# Patient Record
Sex: Female | Born: 1970 | Race: White | Hispanic: Yes | Marital: Single | Smoking: Never smoker
Health system: Southern US, Community
[De-identification: ages and names within clinical notes are randomized; demographics above are authoritative.]

## PROBLEM LIST (undated history)

## (undated) DIAGNOSIS — Z8639 Personal history of other endocrine, nutritional and metabolic disease: Secondary | ICD-10-CM

## (undated) HISTORY — PX: PELVIC LAPAROSCOPY: SHX162

## (undated) HISTORY — DX: Personal history of other endocrine, nutritional and metabolic disease: Z86.39

---

## 1998-02-18 ENCOUNTER — Inpatient Hospital Stay (HOSPITAL_COMMUNITY): Admission: AD | Admit: 1998-02-18 | Discharge: 1998-02-19 | Payer: Self-pay | Admitting: Obstetrics

## 2004-12-14 ENCOUNTER — Ambulatory Visit: Payer: Self-pay | Admitting: Obstetrics & Gynecology

## 2004-12-22 ENCOUNTER — Ambulatory Visit: Payer: Self-pay | Admitting: Obstetrics and Gynecology

## 2004-12-22 ENCOUNTER — Ambulatory Visit (HOSPITAL_COMMUNITY): Admission: RE | Admit: 2004-12-22 | Discharge: 2004-12-22 | Payer: Self-pay | Admitting: Obstetrics and Gynecology

## 2005-01-04 ENCOUNTER — Ambulatory Visit: Payer: Self-pay | Admitting: Obstetrics and Gynecology

## 2005-05-31 ENCOUNTER — Ambulatory Visit: Payer: Self-pay | Admitting: Obstetrics & Gynecology

## 2005-05-31 ENCOUNTER — Encounter: Payer: Self-pay | Admitting: Obstetrics & Gynecology

## 2005-09-27 ENCOUNTER — Emergency Department (HOSPITAL_COMMUNITY): Admission: EM | Admit: 2005-09-27 | Discharge: 2005-09-28 | Payer: Self-pay | Admitting: Emergency Medicine

## 2006-07-14 ENCOUNTER — Ambulatory Visit: Payer: Self-pay | Admitting: Gynecology

## 2006-07-14 ENCOUNTER — Encounter (INDEPENDENT_AMBULATORY_CARE_PROVIDER_SITE_OTHER): Payer: Self-pay | Admitting: Gynecology

## 2007-08-03 IMAGING — CR DG CHEST 2V
2 series · 2 of 2 positions shown · non-contrast
Comparison: None.

CLINICAL DATA: Cough.  
 CHEST - 2 VIEW:

[w chest pa]
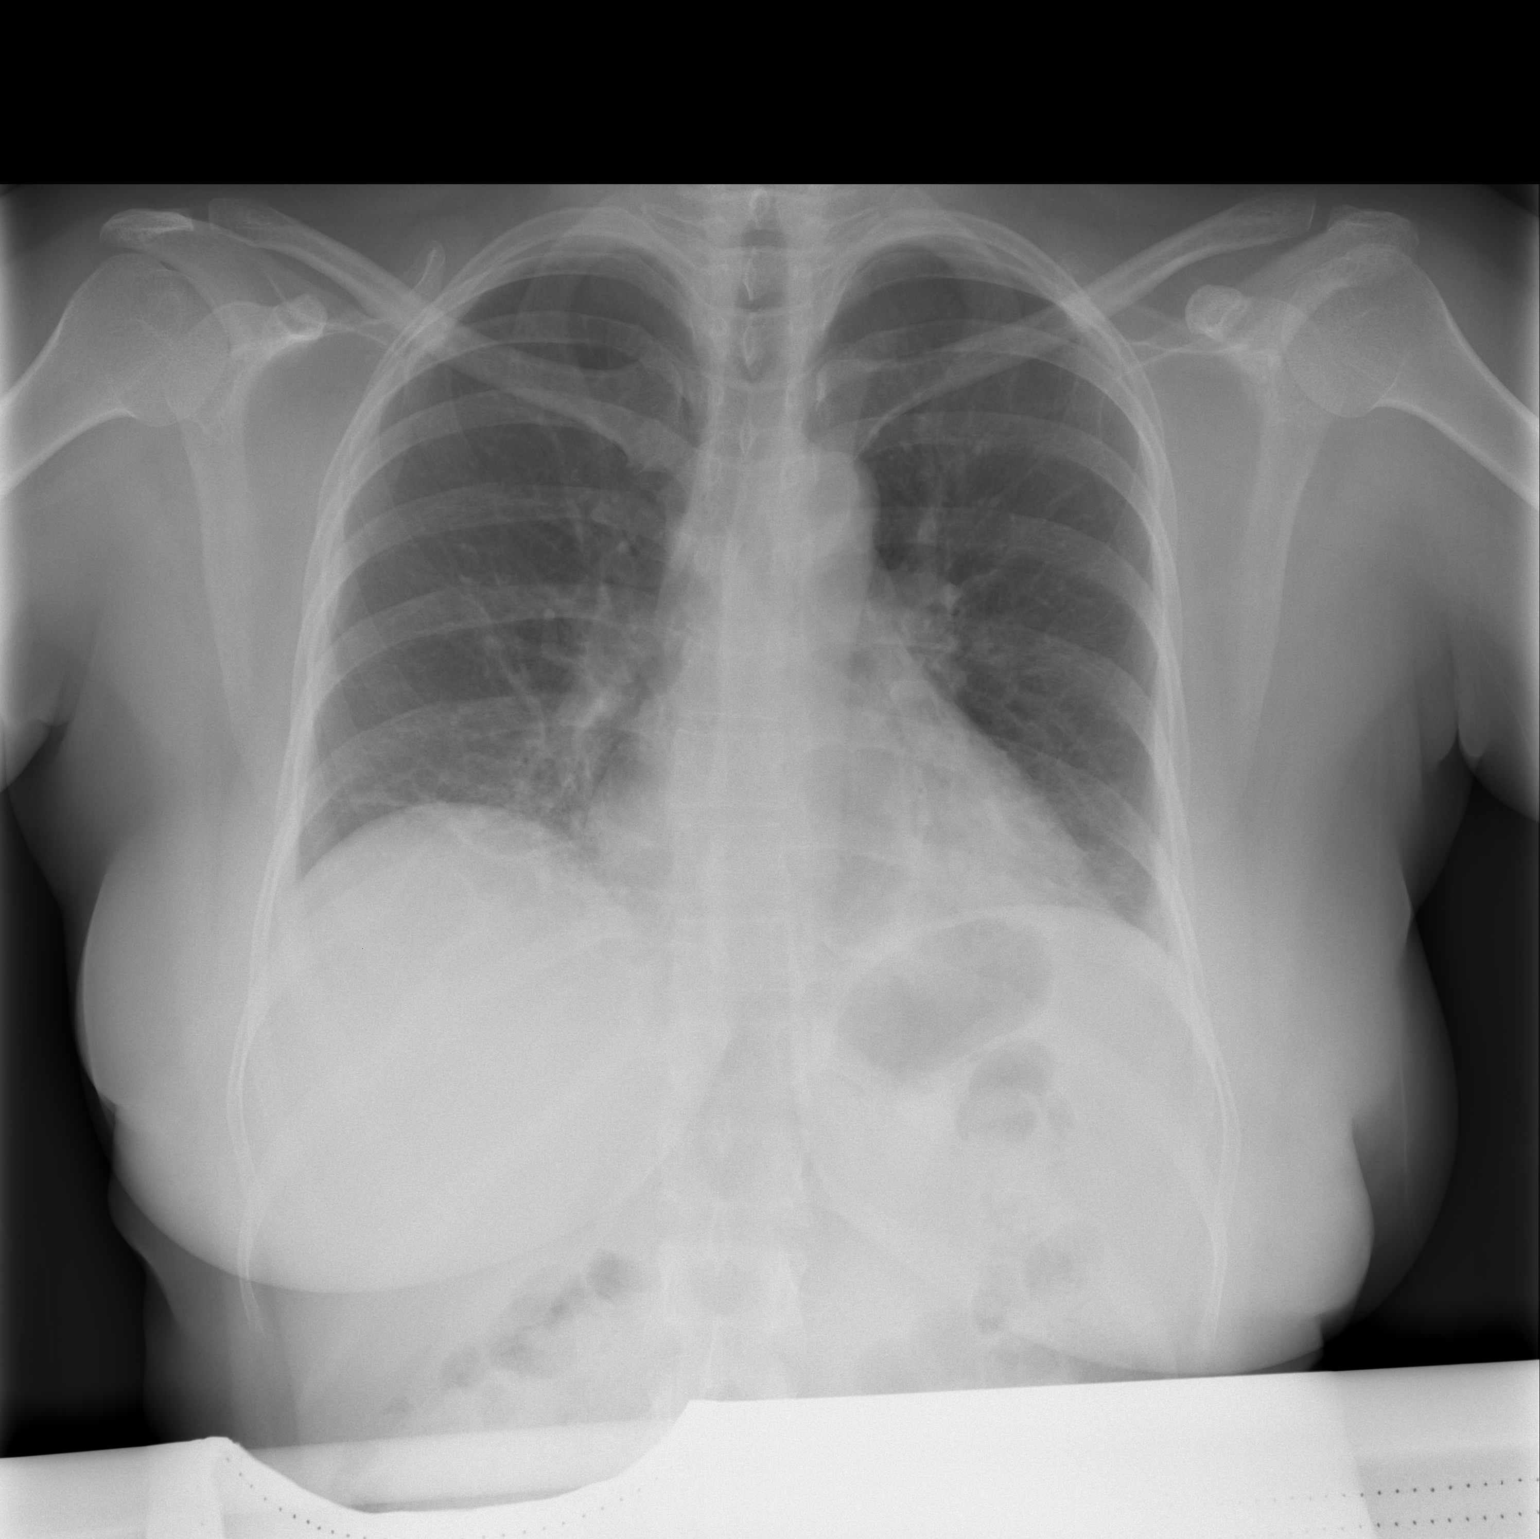

[w chest lat]
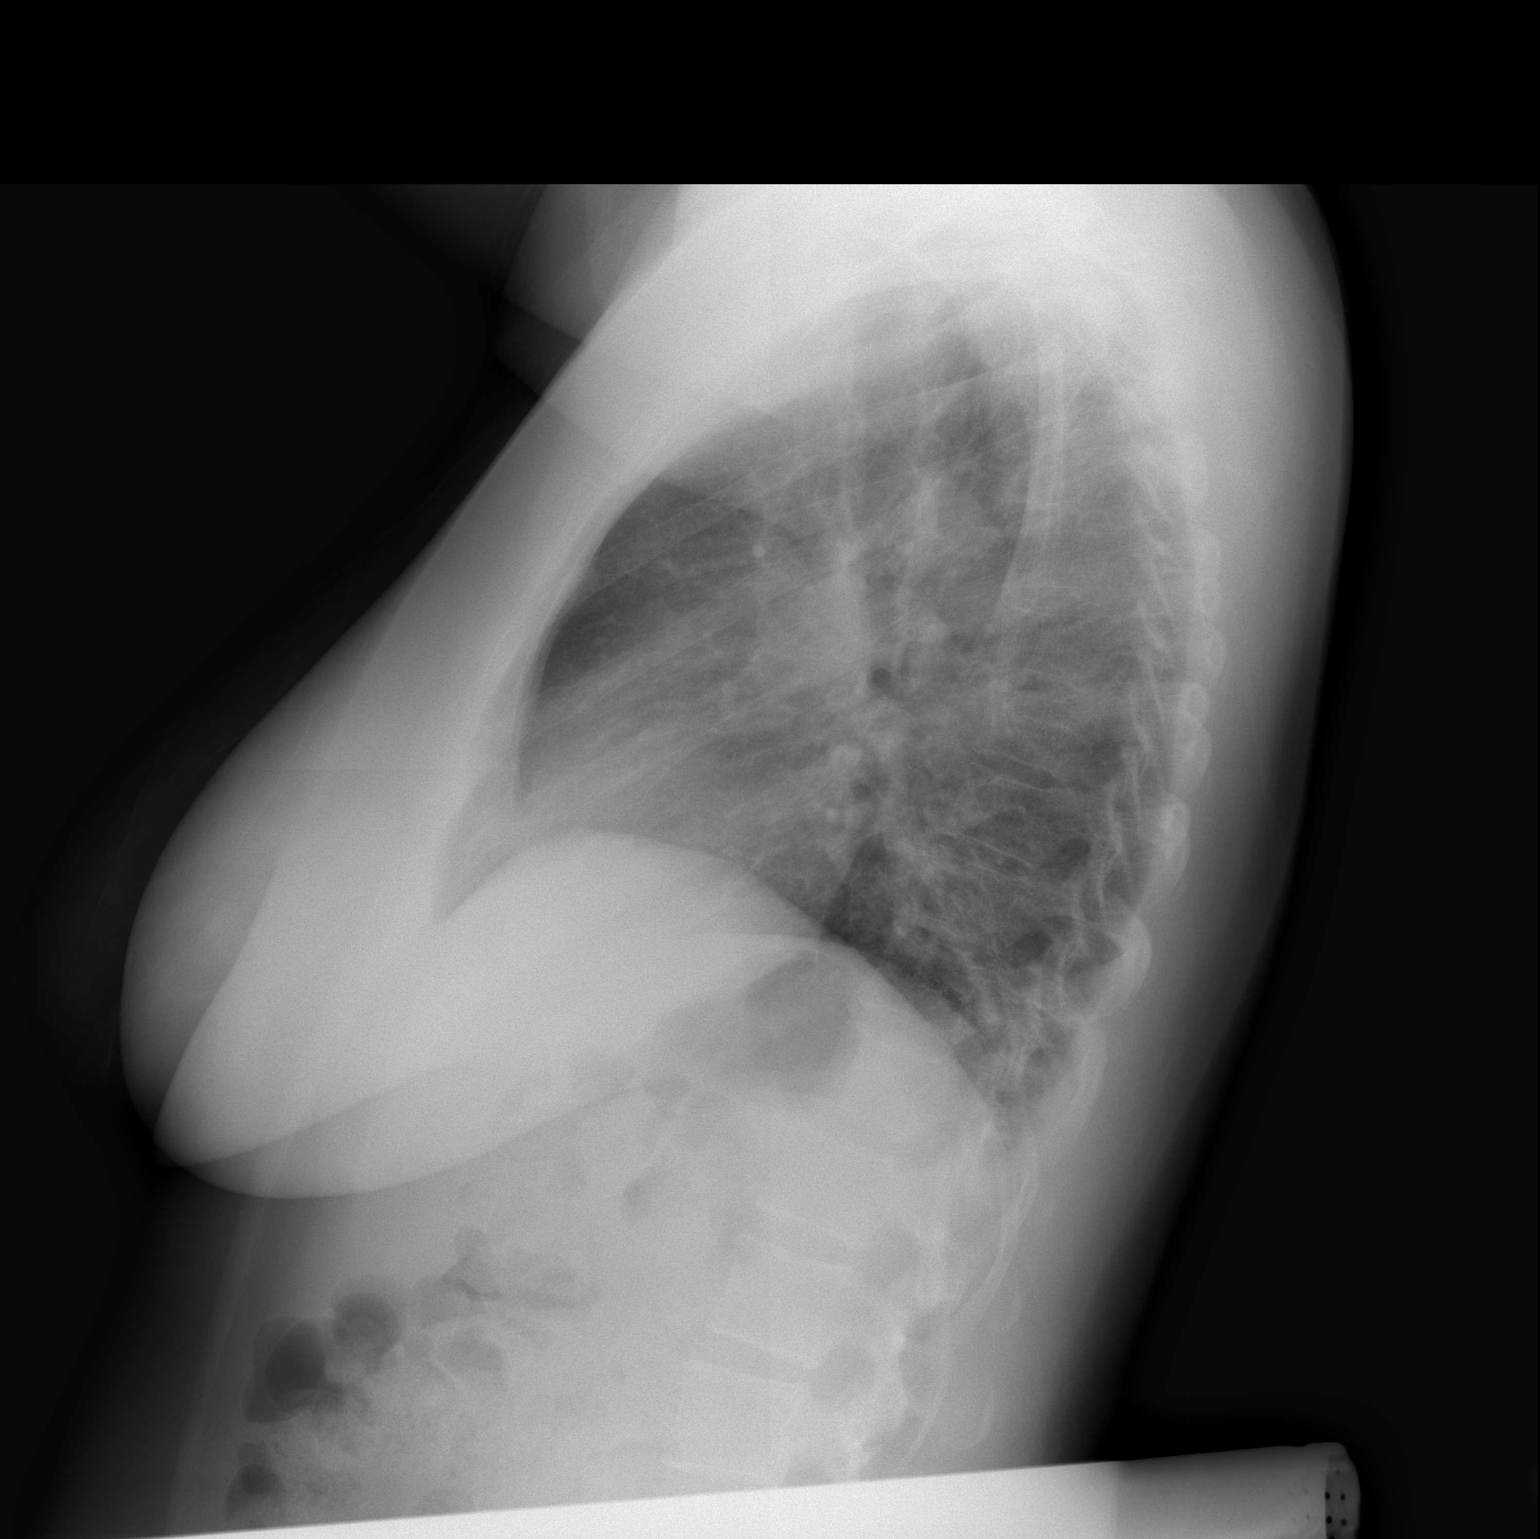

[2 of 2 positions shown; findings below may reference images not displayed]

FINDINGS: There is bibasilar atelectasis.  Heart size normal.  No effusions or edema.
IMPRESSION: Bibasilar atelectasis.

## 2007-08-17 ENCOUNTER — Encounter (INDEPENDENT_AMBULATORY_CARE_PROVIDER_SITE_OTHER): Payer: Self-pay | Admitting: Gynecology

## 2007-08-17 ENCOUNTER — Ambulatory Visit: Payer: Self-pay | Admitting: Gynecology

## 2008-10-10 ENCOUNTER — Encounter: Payer: Self-pay | Admitting: Obstetrics and Gynecology

## 2008-10-10 ENCOUNTER — Ambulatory Visit: Payer: Self-pay | Admitting: Obstetrics and Gynecology

## 2008-10-10 LAB — CONVERTED CEMR LAB
Clue Cells Wet Prep HPF POC: NONE SEEN
Trich, Wet Prep: NONE SEEN
Yeast Wet Prep HPF POC: NONE SEEN

## 2009-02-01 ENCOUNTER — Emergency Department (HOSPITAL_BASED_OUTPATIENT_CLINIC_OR_DEPARTMENT_OTHER): Admission: EM | Admit: 2009-02-01 | Discharge: 2009-02-01 | Payer: Self-pay | Admitting: Emergency Medicine

## 2009-02-04 ENCOUNTER — Emergency Department (HOSPITAL_BASED_OUTPATIENT_CLINIC_OR_DEPARTMENT_OTHER): Admission: EM | Admit: 2009-02-04 | Discharge: 2009-02-04 | Payer: Self-pay | Admitting: Emergency Medicine

## 2009-11-05 ENCOUNTER — Ambulatory Visit: Payer: Self-pay | Admitting: Obstetrics and Gynecology

## 2010-09-29 ENCOUNTER — Ambulatory Visit (INDEPENDENT_AMBULATORY_CARE_PROVIDER_SITE_OTHER): Payer: Commercial Managed Care - PPO | Admitting: Gynecology

## 2010-09-29 ENCOUNTER — Other Ambulatory Visit: Payer: Self-pay | Admitting: Gynecology

## 2010-09-29 ENCOUNTER — Other Ambulatory Visit (HOSPITAL_COMMUNITY)
Admission: RE | Admit: 2010-09-29 | Discharge: 2010-09-29 | Disposition: A | Discharge status: Home / Self Care | Payer: Commercial Managed Care - PPO | Source: Ambulatory Visit | Attending: Gynecology | Admitting: Gynecology

## 2010-09-29 DIAGNOSIS — Z01419 Encounter for gynecological examination (general) (routine) without abnormal findings: Secondary | ICD-10-CM

## 2010-09-29 DIAGNOSIS — Z124 Encounter for screening for malignant neoplasm of cervix: Secondary | ICD-10-CM | POA: Insufficient documentation

## 2010-09-29 DIAGNOSIS — Z13220 Encounter for screening for lipoid disorders: Secondary | ICD-10-CM

## 2010-09-29 DIAGNOSIS — Z833 Family history of diabetes mellitus: Secondary | ICD-10-CM

## 2010-09-29 DIAGNOSIS — R635 Abnormal weight gain: Secondary | ICD-10-CM

## 2010-10-05 ENCOUNTER — Other Ambulatory Visit (INDEPENDENT_AMBULATORY_CARE_PROVIDER_SITE_OTHER): Payer: Commercial Managed Care - PPO

## 2010-10-05 DIAGNOSIS — E0590 Thyrotoxicosis, unspecified without thyrotoxic crisis or storm: Secondary | ICD-10-CM

## 2010-10-29 ENCOUNTER — Other Ambulatory Visit: Payer: Commercial Managed Care - PPO

## 2010-11-22 LAB — GLUCOSE, CAPILLARY: Glucose-Capillary: 105 mg/dL — ABNORMAL HIGH (ref 70–99)

## 2010-11-22 LAB — URINALYSIS, ROUTINE W REFLEX MICROSCOPIC
Bilirubin Urine: NEGATIVE
Protein, ur: NEGATIVE mg/dL
Urobilinogen, UA: 0.2 mg/dL (ref 0.0–1.0)

## 2010-11-22 LAB — URINE MICROSCOPIC-ADD ON

## 2010-11-22 LAB — PREGNANCY, URINE: Preg Test, Ur: NEGATIVE

## 2010-12-28 NOTE — Group Therapy Note (Signed)
NAMECRISSA, SOWDER NO.:  [id number]   MEDICAL RECORD NO.:  [id number]          PATIENT TYPE:  WOC   LOCATION:  [facility]                   FACILITY:  [facility]   PHYSICIAN:  Ginger Carne, MD DATE OF BIRTH:  11-Jan-1971   DATE OF SERVICE:  08/17/2007                                  CLINIC NOTE   This patient is a 40 year old Hispanic female for routine gynecological  evaluation.  The patient says her menses are about every 30 days with  minimal dysmenorrhea.  She has had a tubal ligation 2 years ago.  A 14  point comprehensive review of systems within normal limits.  Please  refer to office records for past history.   PHYSICAL EXAMINATION:  GENERAL:  Pleasant female in no acute distress.  VITAL SIGNS:  Blood pressure 125/84, weight 150 pounds, height 63  inches, pulse 77.  HEENT:  Grossly normal.  CARDIOVASCULAR:  Without murmurs or enlargements.  BREAST:  No masses, thickenings, discharge or tenderness.  Axilla and  supraclavicular nodes negative.  CHEST:  Clear to percussion and auscultation.  Extremities, lymphatic, skin, neurological, cardiovascular all within  normal limits.  ABDOMEN:  Soft without gross hepatosplenomegaly.  GYN:  Pelvic exam:  Pap performed.  Uterus small, anteverted __________  .  Both adnexa palpable and found to be negative.   IMPRESSION:  Normal gynecological examination.  No new family history  for carcinoma or coronary artery heart disease.           ______________________________  Ginger Carne, MD     SHB/MEDQ  D:  08/17/2007  T:  08/17/2007  Job:  [id number]

## 2010-12-28 NOTE — Group Therapy Note (Signed)
NAMECorinna Lines NO.:  [id number]   MEDICAL RECORD NO.:  [id number]           PATIENT TYPE:   LOCATION:  [facility]                     FACILITY:   PHYSICIAN:  Argentina Donovan, MD             DATE OF BIRTH:   DATE OF SERVICE:  10/10/2008                                  CLINIC NOTE   The patient is a 40 year old English-speaking Hispanic female, gravida  2, para 2-0-0-2, who is in for annual examination.  She has never had an  abnormal Pap smear.  She has no significant complaints with exception of  occasional vulvar irritation and itching.  We told her we would do check  for infection, and also counseled her about using irritants such as  soaps and toilet tissue that may irritate.   Otherwise, her review of systems is negative.   She weighs 150 pounds and is 5 feet 3 inches tall, blood pressure  102/64.  NECK:  Supple with thyroid symmetrical with no masses.  LUNGS:  Clear to auscultation and percussion.  HEART:  No murmur, normal sinus rhythm.  BREASTS:  Symmetrical, somewhat pendulous with no masses.  No nipple  discharge.  No supraclavicular or axillary node.  ABDOMEN:  Soft, nontender.  No masses, no organomegaly.  EXTERNAL GENITALIA:  Normal.  BUS within normal limits.  Vagina is clean  and well-rugated.  Cervix is clean and parous.  Uterus anterior, normal  size, shape, and consistency.  The adnexa could not be well-outlined.   IMPRESSION:  Normal gynecological examination.  Wet prep was taken as  well as Pap smear.           ______________________________  Argentina Donovan, MD     PR/MEDQ  D:  10/10/2008  T:  10/10/2008  Job:  [id number]

## 2010-12-31 NOTE — Op Note (Signed)
Bethany Sheppard, Bethany Sheppard            ACCOUNT NO.:  [id number]   MEDICAL RECORD NO.:  [id number]          PATIENT TYPE:  AMB   LOCATION:  [facility]                           [facility]   PHYSICIAN:  Phil D. Okey Dupre, M.D.     DATE OF BIRTH:  [birth year]   DATE OF PROCEDURE:  12/22/2004  DATE OF DISCHARGE:                                 OPERATIVE REPORT   PROCEDURES:  1.  Removal of intrauterine contraceptive device.  2.  Laparoscopic sterilization.   PREOPERATIVE DIAGNOSES:  1.  Intrauterine device in place.  2.  Voluntary sterilization.   POSTOPERATIVE DIAGNOSIS:  1.  Intrauterine device in place.  2.  Voluntary sterilization.   SURGEON:  Javier Glazier. Okey Dupre, M.D.   ESTIMATED BLOOD LOSS:  Minimal.   POSTOPERATIVE CONDITION:  Satisfactory.   Procedure went as follows:  After satisfactory general anesthesia with the  patient in dorsal semilithotomy position, the perineum, vagina and abdomen  prepped and draped in usual sterile manner.  Bimanual pelvic examination under anesthesia revealed the uterus anterior,  freely movable, normal free adnexa, normal size, shape and consistency.  A  straight red catheter was used to empty the bladder and a ring forceps was  used to remove am IUD that was in place.  This anterior lip of the cervix  was then grasped with a single-tooth tenaculum and a uterine clip placed  into the uterus, attached to the cervix for mobilization of the uterus.  A 1  cm transverse incision made just below the umbilicus and a Veress needle  inserted in the peritoneal cavity using the usual precautions to make sure  we were in the peritoneal cavity.  Approximately 2 L carbon dioxide slowly  was insufflated into the peritoneal cavity.  Equal tympany occurred over the  entire abdominal wall.  The Veress needle was removed, the laparoscopic  trocar inserted into the peritoneal cavity and the trocar removed from the  sleeve lap.  Laparoscope inserted.  Both tubes were easily  visualized as  well as the pelvic organs, which were for completely normal with the  exception of a corpus luteum cyst on the left ovary.  Then each tube was  grasped with a coagulating grasper and coagulated blanching occurred at two  separate areas on the tube.  The area observed for bleeding, none was noted  tenaculum.  The scope was removed from the sleeve and as much CO2 as  possible expressed through the  sleeve, the sleeve removed and the incision closed with a 3-0 Vicryl catgut  suture.  We close the fascia and then was run up for a subcuticular closure.  A dry sterile dressing was applied.  The patient transferred to the recovery  room after the tenacula were removed from the vagina.      PDR/MEDQ  D:  12/22/2004  T:  12/22/2004  Job:  [id number]

## 2010-12-31 NOTE — Group Therapy Note (Signed)
NAMESOPHIEA, Bethany Sheppard NO.:  [id number]   MEDICAL RECORD NO.:  [id number]          PATIENT TYPE:  WOC   LOCATION:  [facility]                   FACILITY:  [facility]   PHYSICIAN:  Elsie Lincoln, MD      DATE OF BIRTH:  [birth year]   DATE OF SERVICE:                                    CLINIC NOTE   HISTORY OF PRESENT ILLNESS:  The patient is a 40 year old G2, P2-0-0-2, LMP  November 22, 2004, who is presenting for workup for BTL.  The patient received  funding through __________ [employer].  The patient  understands that this procedure is meant to be permanent and not to be  reversed.  She does understand that there is a 3-4 chance out of a 1000 that  she could become pregnant again.  If pregnancy does occur after a tubal,  there is a much increased risk of ectopic pregnancy.  The patient  understands and accepts these risks.  The patient denies any sicknesses, and  feels well today.   PAST MEDICAL HISTORY:  Denies.   PAST SURGICAL HISTORY:  Denies.   PAST GYNECOLOGICAL HISTORY:  No history of ovarian cysts, fibroid tumors of  the uterus or sexually-transmitted diseases.  She is currently using an IUD  which we will remove at the time of surgery.  The patient has had abnormal  Pap smears in the past, and was followed every 6 months, and is now followed  yearly.  We will request these records from [facility].   ALLERGIES:  Denies.   MEDICATIONS:  Denies.   REVIEW OF SYSTEMS:  Positive for constipation, and the patient takes prune  juice periodically to aid in relieving constipation.   PHYSICAL EXAMINATION:  VITAL SIGNS:  Pulse 76, blood pressure 109/66, weight  143.6, height 5 feet 2 inches.  GENERAL:  Well-nourished, well-developed in no apparent distress.  NECK:  Thyroid with no masses.  Neck is supple.  HEAD:  Normocephalic, atraumatic.  MOUTH:  Good dentition.  BREASTS:  No skin changes, masses or lymphadenopathy.  CHEST:  Clear to  auscultation bilaterally.  HEART:  Regular rate and rhythm.  ABDOMEN:  Soft, nontender, nondistended.  GENITALIA:  Tanner 5.  Vagina is pink.  Normal rugae.  No discharge or  blood.  Cervix closed, nontender.  Uterus mobile and nontender.  Adnexa with  no masses, nontender.  EXTREMITIES:  No edema, and nontender.   ASSESSMENT AND PLAN:  A 40 year old well female for a preoperative visit for  laparoscopic bilateral tubal ligation.  The patient is scheduled for Dec 22, 2004 at 12:30 with Dr. Okey Dupre.  As stated above, we will get Pap results from  [facility] and also remove her IUD at the time of surgery.      KL/MEDQ  D:  12/14/2004  T:  12/14/2004  Job:  [id number]

## 2010-12-31 NOTE — Group Therapy Note (Signed)
NAMEASHONTE, ANGELUCCI NO.:  [id number]   MEDICAL RECORD NO.:  [id number]          PATIENT TYPE:  WOC   LOCATION:  [facility]                   [facility]ACILITY:  [facility]   PHYSICIAN:  Ginger Carne, MD DATE OF BIRTH:  1971-07-26   DATE OF SERVICE:  07/14/2006                                  CLINIC NOTE   SUBJECTIVE:  The patient is a 40 year old, Hispanic female who is seen  today for routine gynecological evaluation.  Menses every 30 days with  minimal dysmenorrhea and no specific complaints.  A 14-point  comprehensive review of systems unremarkable.  Please refer to office  records for further history.   PHYSICAL EXAMINATION:  VITAL SIGNS:  Blood pressure 114/71, weight 150  pounds, height 5 feet 3 inches, pulse 77 and regular.  HEENT:  Grossly normal.  BREASTS:  Breast exam without masses, discharge, thickening or  tenderness.  CHEST:  Clear to percussion and auscultation.  CARDIAC:  Without murmurs or enlargements, regular rate and rhythm.  EXTREMITIES/LYMPHATIC/SKIN/NEUROLOGICAL/MUSCULOSKELETAL:  Within normal  limits.  ABDOMEN:  Soft without gross hepatosplenomegaly.  PELVIC:  Pap performed.  Uterus small, anteverted and flexed.  Both  adnexa palpable and found to be normal.  RECTAL:  Rectal exam deferred.   IMPRESSION:  Normal gynecological evaluation.   PLAN:  The patient will return in 1 year unless there are specific  problems.           ______________________________  Ginger Carne, MD     SHB/MEDQ  D:  07/14/2006  T:  07/14/2006  Job:  [id number]

## 2010-12-31 NOTE — Group Therapy Note (Signed)
Bethany Sheppard, WEBERG NO.:  [id number]   MEDICAL RECORD NO.:  [id number]          PATIENT TYPE:  WOC   LOCATION[facility]                   [facility]   PHYSICIAN:  Elsie Lincoln, MD      DATE OF BIRTH:  1970/12/29   DATE OF SERVICE:                                    CLINIC NOTE   HISTORY:  Patient is a 40 year old female who has been seen by me in May  2006 for a preop appointment for a bilateral tubal ligation.  She did  undergo this on Dec 22, 2004 and is not having any problems.  She comes  today presenting of bilateral breast pain during her menses.  Any other time  she has not felt any masses in her breasts.  She has not noted any discharge  or skin changes.  Patient also says it is time for her Pap smear.  She  denies any change in her medical or surgical history.  She is not on any  medications and she has no allergies.  Last menstrual period was May 24, 2005.  Review of symptoms negative except for breast pain.   EXAMINATION:  VITALS:  Temperature 97.7, pulse 78, blood pressure 117/73,  weight 148.5.  BREASTS:  Breasts fibrocystic however nontender, no discrete masses felt, no  lymphadenopathy.  ABDOMEN:  Abdomen soft, nontender, nondistended, well-healed umbilical  incision.  GENITALIA:  Tanner 5.  Vagina - no discharge, no blood.  Cervix - large,  nontender, closed.  Uterus - nontender, mobile, slight amount of descent  however not a bother to the patient.  Adnexa - no masses, nontender.   ASSESSMENT AND PLAN:  1.  [age]-year-old female with well exam today.  Pap smear and      cultures done.  2.  Evening primrose oil 1.5 g twice daily for breast pain.  3.  Return to clinic in a year.           ______________________________  Elsie Lincoln, MD     KL/MEDQ  D:  05/31/2005  T:  05/31/2005  Job:  [id number]

## 2011-05-20 ENCOUNTER — Encounter: Payer: Self-pay | Admitting: Anesthesiology

## 2011-05-24 ENCOUNTER — Ambulatory Visit (INDEPENDENT_AMBULATORY_CARE_PROVIDER_SITE_OTHER): Payer: Commercial Managed Care - PPO | Admitting: *Deleted

## 2011-05-24 DIAGNOSIS — E0590 Thyrotoxicosis, unspecified without thyrotoxic crisis or storm: Secondary | ICD-10-CM

## 2011-05-24 LAB — T3 UPTAKE: T3 Uptake: 39.5 % — ABNORMAL HIGH (ref 22.5–37.0)

## 2011-05-26 ENCOUNTER — Other Ambulatory Visit: Payer: Self-pay | Admitting: *Deleted

## 2011-05-26 DIAGNOSIS — E039 Hypothyroidism, unspecified: Secondary | ICD-10-CM

## 2011-06-21 ENCOUNTER — Ambulatory Visit (INDEPENDENT_AMBULATORY_CARE_PROVIDER_SITE_OTHER): Payer: Commercial Managed Care - PPO | Admitting: Gynecology

## 2011-06-21 ENCOUNTER — Encounter: Payer: Self-pay | Admitting: Gynecology

## 2011-06-21 VITALS — BP 114/70 | Wt 123.5 lb

## 2011-06-21 DIAGNOSIS — F329 Major depressive disorder, single episode, unspecified: Secondary | ICD-10-CM | POA: Insufficient documentation

## 2011-06-21 DIAGNOSIS — F3289 Other specified depressive episodes: Secondary | ICD-10-CM

## 2011-06-21 DIAGNOSIS — F32A Depression, unspecified: Secondary | ICD-10-CM | POA: Insufficient documentation

## 2011-06-21 DIAGNOSIS — E069 Thyroiditis, unspecified: Secondary | ICD-10-CM | POA: Insufficient documentation

## 2011-06-21 NOTE — Progress Notes (Signed)
Patient is a 40 year old gravida 2 para 2 who was seen in the office as a new patient in February of this year. Her Pap smear and routine labs were normal with the exception of a TSH which was found to be elevated at 12.80 she returned 2 weeks later and had a full thyroid panel with the following results: TSH was normal at 1.950 T4 was normal at 6.2 and the T3 uptake was slightly elevated at 37.4 upper limits of normal being 37%. She was asked to return to the office 6 months later to repeat the thyroid function test which she had done on October 11 of this year her TSH was normal at 1.55 and her T4 was normal at 6.7 but her T3 uptake was elevated at 39.5. There appears to have been a gradual increase on T3 uptake over the course of the past 6 months indicative of T3 toxicosis. For this reason we are going to refer her to the endocrinologist for further testing. On today's exam she had no thyromegaly and no carotid bruits. She is suffering from depression such as like a sleep and her gene decrease appetite but part of this may be attributed to her daughter and we will wait a college. We're going to place her on an antidepressant agent such as Prestiq 50 mg to take daily for 6 months. We'll for a copy of this office note to my endocrinology colleague for further evaluation.

## 2011-06-30 ENCOUNTER — Telehealth: Payer: Self-pay | Admitting: *Deleted

## 2011-06-30 NOTE — Telephone Encounter (Signed)
Patient was set up with Dr. Talmage Nap on 08/01/11 @10 :30am.  Patient was informed by there office.

## 2011-06-30 NOTE — Telephone Encounter (Signed)
Message copied by Libby Maw on Thu Jun 30, 2011 11:50 AM ------      Message from: Ok Edwards      Created: Tue Jun 21, 2011 12:59 PM       Azariya Freeman please set up an appointment for/consult with Dr. Lurene Shadow endocrinologist I have sent her a consult note to her already. Diagnosis is T3 thyrotoxicosis. Patient waiting for your call. Thank you

## 2011-07-15 ENCOUNTER — Other Ambulatory Visit: Payer: Self-pay | Admitting: Gynecology

## 2011-07-15 DIAGNOSIS — Z1231 Encounter for screening mammogram for malignant neoplasm of breast: Secondary | ICD-10-CM

## 2011-08-22 ENCOUNTER — Ambulatory Visit (HOSPITAL_COMMUNITY)
Admission: RE | Admit: 2011-08-22 | Discharge: 2011-08-22 | Disposition: A | Discharge status: Home / Self Care | Payer: Commercial Managed Care - PPO | Source: Ambulatory Visit | Attending: Gynecology | Admitting: Gynecology

## 2011-08-22 DIAGNOSIS — Z1231 Encounter for screening mammogram for malignant neoplasm of breast: Secondary | ICD-10-CM | POA: Insufficient documentation

## 2012-01-27 ENCOUNTER — Ambulatory Visit (INDEPENDENT_AMBULATORY_CARE_PROVIDER_SITE_OTHER): Payer: Commercial Managed Care - PPO | Admitting: Gynecology

## 2012-01-27 ENCOUNTER — Other Ambulatory Visit (HOSPITAL_COMMUNITY)
Admission: RE | Admit: 2012-01-27 | Discharge: 2012-01-27 | Disposition: A | Discharge status: Home / Self Care | Payer: Commercial Managed Care - PPO | Source: Ambulatory Visit | Attending: Gynecology | Admitting: Gynecology

## 2012-01-27 ENCOUNTER — Encounter: Payer: Self-pay | Admitting: Gynecology

## 2012-01-27 VITALS — BP 110/70 | Ht 64.5 in | Wt 140.0 lb

## 2012-01-27 DIAGNOSIS — R635 Abnormal weight gain: Secondary | ICD-10-CM

## 2012-01-27 DIAGNOSIS — N898 Other specified noninflammatory disorders of vagina: Secondary | ICD-10-CM

## 2012-01-27 DIAGNOSIS — N760 Acute vaginitis: Secondary | ICD-10-CM

## 2012-01-27 DIAGNOSIS — Z01419 Encounter for gynecological examination (general) (routine) without abnormal findings: Secondary | ICD-10-CM

## 2012-01-27 DIAGNOSIS — L293 Anogenital pruritus, unspecified: Secondary | ICD-10-CM

## 2012-01-27 DIAGNOSIS — A499 Bacterial infection, unspecified: Secondary | ICD-10-CM

## 2012-01-27 DIAGNOSIS — Z1159 Encounter for screening for other viral diseases: Secondary | ICD-10-CM | POA: Insufficient documentation

## 2012-01-27 DIAGNOSIS — Z113 Encounter for screening for infections with a predominantly sexual mode of transmission: Secondary | ICD-10-CM

## 2012-01-27 DIAGNOSIS — B9689 Other specified bacterial agents as the cause of diseases classified elsewhere: Secondary | ICD-10-CM

## 2012-01-27 DIAGNOSIS — E069 Thyroiditis, unspecified: Secondary | ICD-10-CM

## 2012-01-27 LAB — CBC WITH DIFFERENTIAL/PLATELET
Eosinophils Relative: 2 % (ref 0–5)
HCT: 38.3 % (ref 36.0–46.0)
Lymphocytes Relative: 23 % (ref 12–46)
Lymphs Abs: 1 10*3/uL (ref 0.7–4.0)
MCH: 29.9 pg (ref 26.0–34.0)
MCHC: 32.9 g/dL (ref 30.0–36.0)
Monocytes Absolute: 0.5 10*3/uL (ref 0.1–1.0)
Neutro Abs: 2.9 10*3/uL (ref 1.7–7.7)
Neutrophils Relative %: 63 % (ref 43–77)
RDW: 15.4 % (ref 11.5–15.5)
WBC: 4.5 10*3/uL (ref 4.0–10.5)

## 2012-01-27 LAB — LIPID PANEL
Cholesterol: 151 mg/dL (ref 0–200)
LDL Cholesterol: 83 mg/dL (ref 0–99)
Total CHOL/HDL Ratio: 2.6 Ratio
Triglycerides: 49 mg/dL (ref <150)

## 2012-01-27 LAB — GLUCOSE, RANDOM: Glucose, Bld: 79 mg/dL (ref 70–99)

## 2012-01-27 LAB — WET PREP FOR TRICH, YEAST, CLUE: WBC, Wet Prep HPF POC: NONE SEEN

## 2012-01-27 LAB — THYROID PANEL WITH TSH
Free Thyroxine Index: 2.6 (ref 1.0–3.9)
T4, Total: 7.1 ug/dL (ref 5.0–12.5)

## 2012-01-27 MED ORDER — METRONIDAZOLE 500 MG PO TABS: 500.0000 mg | ORAL_TABLET | Freq: Two times a day (BID) | ORAL | Status: AC

## 2012-01-27 NOTE — Progress Notes (Signed)
Bethany Sheppard 11-16-1970 [id number]   History:    41 y.o.  for annual gyn exam with complaint of one-month history of vaginal pruritus. She's had a new sexual partner in the past 3 months. She also was followed last year but by Dr. Lurene Shadow for a mild thyroiditis whereas her T3 was elevated and the rest her thyroid panel was normal. She is to schedule followup appointment next couple weeks. She states her menstrual cycles are regular. She had a previous tubal ligation. Her last mammogram November 2012 which was normal. She does her monthly self breast examination. Last year she suffered from depression but no longer. She had lost weight and now has regained it. She was weighing 127 is up to 140 now. Otherwise she has been doing well.  Past medical history,surgical history, family history and social history were all reviewed and documented in the EPIC chart.  Gynecologic History Patient's last menstrual period was 01/02/2012. Contraception: tubal ligation Last Pap: 2012. Results were: normal Last mammogram: 2012. Results were: normal  Obstetric History OB History    Grav Para Term Preterm Abortions TAB SAB Ect Mult Living   2 2 2       2      # Outc Date GA Lbr Len/2nd Wgt Sex Del Anes PTL Lv   1 TRM     F SVD  No Yes   2 TRM     F SVD   Yes       ROS: A ROS was performed and pertinent positives and negatives are included in the history.  GENERAL: No fevers or chills. HEENT: No change in vision, no earache, sore throat or sinus congestion. NECK: No pain or stiffness. CARDIOVASCULAR: No chest pain or pressure. No palpitations. PULMONARY: No shortness of breath, cough or wheeze. GASTROINTESTINAL: No abdominal pain, nausea, vomiting or diarrhea, melena or bright red blood per rectum. GENITOURINARY: No urinary frequency, urgency, hesitancy or dysuria. MUSCULOSKELETAL: No joint or muscle pain, no back pain, no recent trauma. DERMATOLOGIC: No rash, no itching, no lesions. ENDOCRINE: No  polyuria, polydipsia, no heat or cold intolerance. No recent change in weight. HEMATOLOGICAL: No anemia or easy bruising or bleeding. NEUROLOGIC: No headache, seizures, numbness, tingling or weakness. PSYCHIATRIC: No depression, no loss of interest in normal activity or change in sleep pattern.     Exam: chaperone present  BP 110/70  Ht 5' 4.5" (1.638 m)  Wt 140 lb (63.504 kg)  BMI 23.66 kg/m2  LMP 01/02/2012  Body mass index is 23.66 kg/(m^2).  General appearance : Well developed well nourished female. No acute distress HEENT: Neck supple, trachea midline, no carotid bruits, no thyroidmegaly Lungs: Clear to auscultation, no rhonchi or wheezes, or rib retractions  Heart: Regular rate and rhythm, no murmurs or gallops Breast:Examined in sitting and supine position were symmetrical in appearance, no palpable masses or tenderness,  no skin retraction, no nipple inversion, no nipple discharge, no skin discoloration, no axillary or supraclavicular lymphadenopathy Abdomen: no palpable masses or tenderness, no rebound or guarding Extremities: no edema or skin discoloration or tenderness  Pelvic:  Bartholin, Urethra, Skene Glands: Within normal limits             Vagina: No gross lesions or discharge, menstrual blood noted  Cervix: No gross lesions or discharge  Uterus  anteverted, normal size, shape and consistency, non-tender and mobile  Adnexa  Without masses or tenderness  Anus and perineum  normal   Rectovaginal  normal sphincter tone without palpated masses  or tenderness             Hemoccult not done   Wet prep: Moderate amount of clear cells were noted along with numerous to count white blood cells and pos Amine Test  Assessment/Plan:  41 y.o. female for annual exam with past history of mild thyroiditis (elevated T3 and remainder thyroid panel normal in 2012). Thyroid panel repeated today. Patient to followup with Dr. Lurene Shadow the next few weeks we'll forward this office note and labs  as well. The following labs will also be drawn today: Fasting blood sugar, fasting lipid profile, CBC, urinalysis and Pap smear. We did discuss the new screening guidelines her Pap smear every 3 years. Since she's had a new sexual partner we'll do the Pap smear today and if normal will follow by the guidelines and do a Pap smear with 3 years. She was reminded to do her mammogram at the end of the year and to continue her monthly self breast examination. We discouraged her to do regular vaginal douching. She will use either refresh or Luvena twice a week. She will be prescribed Flagyl 500 mg to take 1 by mouth twice a day for her bacterial vaginosis since she's currently menstruating. Will notify her if there is any abnormality in any of the above-mentioned test otherwise we will see her back in one year or when necessary. She was reminded to follow up with Dr. Lurene Shadow.   Ok Edwards MD, 10:39 AM 01/27/2012

## 2012-01-27 NOTE — Patient Instructions (Addendum)
Mantenimiento de la salud en las mujeres (Health Maintenance, Females) Un estilo de vida saludable y los cuidados preventivos pueden favorecer la salud y el bienestar.   Haga exmenes regulares de la salud en general, dentales y de los ojos.   Consuma una dieta saludable. Los alimentos como vegetales, frutas, granos enteros, productos lcteos descremados y protenas magras contienen los nutrientes que usted necesita sin necesidad de consumir muchas caloras. Disminuya el consumo de alimentos con alto contenido de grasas slidas, azcar y sal agregadas. Si es necesario, pdaleinformacin acerca de una dieta adecuada a su mdico.   La actividad fsica regular es una de las cosas ms importantes que puede hacer por su salud. Los adultos deben hacer al menos 150 minutos de ejercicios de intensidad moderada (cualquier actividad que aumente la frecuencia cardaca y lo haga transpirar) cada semana. Adems, la mayora de los adultos necesita ejercicios de fortalecimiento muscular 2  ms das por semana.    Mantenga un peso saludable. El ndice de masa corporal (IMC) es una herramienta que identifica posibles problemas con el peso. Proporciona una estimacin de la grasa corporal basndose en el peso y la altura. El mdico podr determinar su IMC y podr ayudarlo a lograr o mantener un peso saludable. Para los adultos de 20 aos o ms:   Un IMC menor a 18,5 se considera bajo peso.   Un IMC entre 18,5 y 24,9 es normal.   Un IMC entre 25 y 29,9 es sobrepeso.   Un IMC entre 30 o ms es obesidad.   Mantenga un nivel normal de lpidos y colesterol en sangre practicando actividad fsica y minimizando la ingesta de grasas saturadas. Consuma una dieta balanceada e incluya variedad de frutas y vegetales. Los anlisis de lpidos y colesterol en sangre deben comenzar a los 20 aos y repetirse cada 5 aos. Si los niveles de colesterol son altos, tiene ms de 50 aos o tiene riesgo elevado de sufrir enfermedades  cardacas, necesitar controlarse con ms frecuencia.Si tiene niveles elevados de lpidos y colesterol, debe recibir tratamiento con medicamentos, si la dieta y el ejercicio no son efectivos.   Si fuma, consulte con el profesional acerca de las opciones para dejar de hacerlo. Si no lo hace, no comience.   Si est embarazada no beba alcohol. Si est amamantando, beba alcohol con prudencia. Si elige beber alcohol, no se exceda de 1 medida por da. Se considera una medida a 12 onzas (355 ml) de cerveza, 5 onzas (148 ml) de vino, o 1,5 onzas (44 ml) de licor.   Evite el alcohol y el consumo de drogas. No comparta agujas. Pida ayuda si necesita asistencia o instrucciones con respecto a abandonar el consumo de alcohol, cigarrillos o drogas.   La hipertensin arterial causa enfermedades cardacas y aumenta el riesgo de ictus. Debe controlar su presin arterial al menos cada 1 o 2 aos. La presin arterial elevada que persiste debe tratarse con medicamentos si la prdida de peso y el ejercicio no son efectivos.   Si tiene entre 55 y 79 aos, consulte a su mdico si debe tomar aspirina para prevenir enfermedades cardacas.   Los anlisis para la diabetes incluyen la toma de una muestra de sangre para controlar el nivel de azcar en la sangre durante el ayuno. Debe hacerlo cada 3 aos despus de los 45 aos si est dentro de su peso normal y sin factores de riesgo para la diabetes. Las pruebas deben comenzar a edades tempranas o llevarse a cabo con ms frecuencia   si tiene sobrepeso y al menos 1 factor de riesgo para la diabetes.   Las evaluaciones para detectar el cncer de mama son un mtodo preventivo fundamental para las mujeres. Debe practicar la "autoconciencia de las mamas". Esto significa que debe reconocer la apariencia normal de sus mamas y como las siente y pudiendo incluir un autoexamen de mamas. Si detecta algn cambio, no importa cun pequeo sea, debe informarlo a su mdico. Las mujeres entre 20 y  40 aos deben hacer un examen clnico de las mamas como parte del examen regular de salud, cada 1 a 3 aos. Despus de los 40 aos deben hacerlo todos los aos. Deben hacerse una mamografa radografa de mamas ) cada ao, comenzando a los 40 aos. Las mujeres con historia familiar de cncer de mama deben hablar con el mdico para hacer un estudio gentico. Las que tienen ms riesgo deben hacerse resonancia magntica y una mamografa todos los aos.   Un test de Pap se realiza para diagnosticar cncer de cuello de tero. Las mujeres deben hacerse un test de Pap a partir de los 21 aos. Entre los 21 y los 29 aos debe repetirse cada dos aos. Luego de los 30 aos, debe realizarse un test de Pap cada tres aos siempre que los 3 estudios anteriores sean normales. Si le han realizado una histerectoma por un problema que no era cncer u otra enfermedad que podra causar cncer, ya no necesitar un test de Pap. Si tiene entre 65 y 70 aos y ha tenido un test de Pap normal en los ltimos 10 aos, ya no ser necesario realizarlo. Si ha recibido un tratamiento para el cncer cervical o para una enfermedad que podra causar cncer, necesitar realizar un test de Pap y controles durante al menos 20 aos de concluir el tratamiento. Si no se ha hecho el examen con regularidad, debern volver a evaluarse los factores de riesgo (como el tener un nuevo compaero sexual) para determinar si debe volver a realizarse los estudios. Algunas mujeres sufren problemas mdicos que aumentan la probabilidad de contraer cncer cervical. En estos casos, el mdico podr indicar que se realice el test de Pap con ms frecuencia.   La prueba del virus del papiloma humano (VPH) es un anlisis adicional que puede usarse para detectar cncer de cuello de tero. Esta prueba busca la presencia del virus que causa los cambios en el cuello. Las clulas que se recolectan durante el test de Pap pueden usarse para el VPH. La prueba para el VPH puede  usarse para evaluar a mujeres de ms de 30 aos y debe usarse en mujeres de cualquier edad cuyos resultados del test de Pap no sean claros. Despus de los 30 aos, las mujeres deben hacerse el anlisis para el VPH con la misma frecuencia que el test de Pap.   El cncer colorectal puede detectarse y con frecuencia puede prevenirse. La mayor parte de los estudios de rutina comienzan a los 50 aos y continan hasta los 75 aos. Sin embargo, el mdico podr aconsejarle que lo haga antes, si tiene factores de riesgo para el cncer de colon. Una vez por ao, el profesional le dar un kit de prueba para hallar sangre oculta en la materia fecal. La utilizacin de un tubo con una pequea cmara en su extremo para examinar directamente el colon (sigmoidoscopa o colonoscopa), puede detectar formas temprana de cncer colorectal. Hable con su mdico si tiene 50 aos, cuando comience con los estudios de rutina. El examen directo del   colon debe repetirse cada 5 a 10 aos, hasta los 75 aos, excepto que se encuentren formas tempranas de plipos precancerosos o pequeos bultos.   Se recomienda realizar un anlisis de sangre para Engineer, manufacturing hepatitis C a todas las personas [address] 1947 y 1967, y a todo aquel que tenga un riesgo conocido de haber contrado esta enfermedad.   Practique el sexo seguro. Use condones y evite las prcticas sexuales riesgosas para disminuir el contagio de enfermedades de transmisin sexual. Las mujeres sexualmente activas de 25 aos o menos deben controlarse para descartar clamidia, que es una infeccin de transmisin sexual frecuente. Las [employer] que tengan mltiples compaeros tambin deben hacerse el anlisis para Engineer, manufacturing clamidia. Se recomienda realizar anlisis para detectar otras enfermedades de transmisin sexual si es sexualmente [place] y tiene riesgos.   La osteoporosis es una enfermedad en la que los huesos pierden los minerales y la fuerza por el avance de la edad. El  resultado pueden ser fracturas graves en los [place]. El riesgo de osteoporosis puede identificarse con Neomia Dear prueba de densidad sea. Las mujeres de ms de 65 aos y las que tengan riesgos de sufrir fracturas u osteoporosis deben pedir consejo a su mdico. Consulte a su mdico si debe tomar un suplemento de calcio o de vitamina D para reducir el riesgo de osteoporosis.   La menopausia se asocia a sntomas y riesgos fsicos. Se dispone de una terapia de reemplazo hormonal para disminuir los sntomas y [place]. Consulte a su mdico para saber si la terapia de reemplazo hormonal es conveniente para usted.   Use una pantalla solar con un factor SPF de 30 o mayor. Aplique pantalla de Pietro Cassis y repetida a lo largo del Futures trader. Pngase al resguardo del sol cuando la sombra sea ms pequea que usted. Protjase usando mangas y [employer], un sombrero de ala ancha y gafas para el sol todo el ao, siempre que se encuentre en el exterior.   Informe a su mdico si aparecen nuevos lunares o los que tiene se modifican, especialmente en forma y color. Tambin notifique al mdico si un lunar es ms grande que el tamao de una goma de Paramedic.   Mantngase al da con las vacunas.  Document Released: 07/21/2011 [facility] Patient Information 2012 [place], [place].  Vaginosis bacteriana (Bacterial Vaginosis) La vaginosis bacteriana es una infeccin vaginal en la que el equilibrio normal de las bacterias de la vagina se modifica. Este equilibrio normal se ve afectado por un desarrollo excesivo de ciertas bacterias. Hay diferentes tipos de bacteria que causan la vaginosis bacteriana. Es el problema vaginal ms comn en las mujeres de edad frtil. CAUSAS  La causa de este trastorno no se conoce bien. Se produce como consecuencia de un aumento o desequilibrio de las bacterias nocivas.   Algunas actividades o conductas pueden poner en peligro el equilibrio normal de las bacterias en la vagina, y Astronomer.  Entre ellas:   Tener un compaero sexual o mltiples compaeros sexuales.   Las duchas vaginales   Usar un dispositivo intrauterino (DIU) como mtodo anticonceptivo.   No se conoce el papel que juega la actividad sexual en el desarrollo de [place] VB. Sin embargo, las mujeres que nunca tuvieron relaciones sexuales raramente se infectan.  El contagio no se produce en asientos de baos, camas, piscinas o por tocar objetos.  SNTOMAS  Flujo vaginal grisceo.   Olor parecido al pescado con la secrecin, en especial despus de Management consultant.   Picazn o  irritacin de la vagina y la vulva.   Ardor o dolor al [employer].   Algunas mujeres no presentan ningn sntoma.  DIAGNSTICO El mdico realizar un examen vaginal para diagnosticar una vaginosis bacteriana. El mdico le indicar anlisis de laboratorio y observar las muestras del lquido vaginal en el microscopio. Buscar bacterias y clulas anormales (clulas clave), pH mayor a 4.5 y [place] prueba de aminas positivo, todos ellos asociados al BV.  RIESGOS Y COMPLICACIONES  Enfermedad plvica inflamatoria (EPI).   Infecciones luego de una ciruga ginecolgica.   VIH.   Virus del Herpes  TRATAMIENTO En algunos casos, la infeccin desaparece sin tratamiento. Sin embargo, todas las mujeres con sntomas de VB deben tratarse para evitar complicaciones, especialmente si se ha planificado una ciruga ginecolgica. Los compaeros varones generalmente no necesitan tratamiento. Sin embargo, puede contagiarse entre parejas femeninas, de modo que el tratamiento se realiza para Dietitian.   La VB puede tratarse con medicamentos que destruyen grmenes (antibiticos). Estos se presentan en pldoras o en cremas vaginales. Tanto mujeres embarazadas como no embarazadas pueden usar ambos, pero se indican en dosis diferentes. Estos antibiticos no daan al beb.   La VB puede recurrir [employer]. Si esto ocurre, se prescribir un  segundo tratamiento con antibiticos.   El tratamiento es importante en el caso de las mujeres [place]. Si no se trata, la VB puede causar [employer], especialmente en [employer] que ha tenido un parto prematuro en el pasado. Todas las mujeres embarazadas que tienen sntomas de VB deben ser controladas y tratadas.   En los casos de recurrencia crnica, se prescribe un tratamiento con un gel vaginal dos veces por semana  INSTRUCCIONES PARA EL CUIDADO DOMICILIARIO  Tome los medicamentos que le indic el mdico.   No mantenga relaciones sexuales Librarian, academic.   Comunique a sus compaeros sexuales que sufre una infeccin vaginal. Ellos deben concurrir para un control mdico si tienen problemas como una urticaria leve o picazn.   Practique el sexo seguro. Use preservativos. Tenga un solo compaero sexual.  PREVENCIN Algunos pasos bsicos de prevencin pueden ayudar a reducir el riesgo de desequilibrio de las bacterias vaginales y de sufrir VB.  No mantener relaciones sexuales (abstinencia)   No utilice duchas vaginales.   Utilice todos los [employer] han prescripto para el [place], aunque los sntomas hayan desaparecido.   Comunique a su compaero sexual que sufre una VB. De ese modo podr tratase, si es necesario, y podr Economist.  SOLICITE ATENCIN MDICA SI:  Los sntomas no mejoran luego de [address]  [address] [place].   Aumentan la secrecin, el dolor o la fiebre.  ASEGRESE QUE:   Comprende estas instrucciones.   Controlar su enfermedad.   Solicitar ayuda de inmediato si no mejora o empeora.  PARA MS INFORMACIN: Division de STD Prevention (DSTDP), Centers for Disease Control and Prevention (Centros para el control y la prevencin de enfermedades, CDC): SolutionApps.co.za American Social Health Association (ASHA): www.ashastd.org  Document Released: 11/08/2007 Document Revised: 07/21/2011 [facility] Patient Information 2012  [place], [place].

## 2012-01-28 LAB — GC/CHLAMYDIA PROBE AMP, GENITAL: Chlamydia, DNA Probe: NEGATIVE

## 2012-01-28 LAB — URINALYSIS W MICROSCOPIC + REFLEX CULTURE
Bilirubin Urine: NEGATIVE
Casts: NONE SEEN
Ketones, ur: NEGATIVE mg/dL
Leukocytes, UA: NEGATIVE
pH: 7.5 (ref 5.0–8.0)

## 2012-01-31 NOTE — Progress Notes (Signed)
Quick Note:  Please inform patient her urine culture demonstrated a UTI. Please call in Macrobid one PO BID for 7 days. #[id number] ______

## 2013-02-08 ENCOUNTER — Encounter: Payer: Self-pay | Admitting: Gynecology

## 2013-03-08 ENCOUNTER — Encounter: Payer: Self-pay | Admitting: Gynecology

## 2013-03-28 ENCOUNTER — Encounter: Payer: Self-pay | Admitting: Gynecology

## 2013-03-28 ENCOUNTER — Ambulatory Visit (INDEPENDENT_AMBULATORY_CARE_PROVIDER_SITE_OTHER): Payer: Commercial Managed Care - PPO | Admitting: Gynecology

## 2013-03-28 VITALS — BP 116/72 | Ht 63.0 in | Wt 131.0 lb

## 2013-03-28 DIAGNOSIS — Z01419 Encounter for gynecological examination (general) (routine) without abnormal findings: Secondary | ICD-10-CM

## 2013-03-28 DIAGNOSIS — M6289 Other specified disorders of muscle: Secondary | ICD-10-CM

## 2013-03-28 DIAGNOSIS — N6320 Unspecified lump in the left breast, unspecified quadrant: Secondary | ICD-10-CM

## 2013-03-28 DIAGNOSIS — E069 Thyroiditis, unspecified: Secondary | ICD-10-CM

## 2013-03-28 DIAGNOSIS — Z23 Encounter for immunization: Secondary | ICD-10-CM

## 2013-03-28 DIAGNOSIS — N63 Unspecified lump in unspecified breast: Secondary | ICD-10-CM

## 2013-03-28 DIAGNOSIS — R29898 Other symptoms and signs involving the musculoskeletal system: Secondary | ICD-10-CM

## 2013-03-28 LAB — CBC WITH DIFFERENTIAL/PLATELET
Basophils Absolute: 0.1 10*3/uL (ref 0.0–0.1)
MCH: 28 pg (ref 26.0–34.0)
MCHC: 32.9 g/dL (ref 30.0–36.0)
MCV: 85 fL (ref 78.0–100.0)
WBC: 4.8 10*3/uL (ref 4.0–10.5)

## 2013-03-28 LAB — CHOLESTEROL, TOTAL: Cholesterol: 184 mg/dL (ref 0–200)

## 2013-03-28 NOTE — Patient Instructions (Signed)
TVacuna difteria, ttanos, tos ferina (DTP) - Lo que debe saber  (Tetanus, Diphtheria, Pertussis [Tdap] Vaccine, What You Need to Know) PORQU VACUNARSE?  El ttanos, la difteria y la tos [place] pueden ser enfermedades muy graves, an en adolescentes y [place]. La vacuna Tdap nos puede proteger de estas enfermedades.  El TTANOS (Trismo) provoca la contraccin dolorosa de los msculos, por lo general, en todo el cuerpo.   Puede causar el endurecimiento de los msculos de la cabeza y el cuello, de modo que impide abrir la boca, tragar y en algunos casos, Industrial/product designer. El ttanos causa la muerte de 1 de cada 5 personas que se infectan. La DIFTERIA produce la formacin de una membrana gruesa que cubre el fondo de la garganta.   Puede causar problemas respiratorios, parlisis, insuficiencia cardaca e incluso la muerte. TOS FERINA (Pertusis) causa episodios de tos graves, que pueden hacer difcil la respiracin, causar vmitos y trastornos del sueo.   Tambin puede ser la causa de prdida de [place], incontinencia y Surveyor, minerals de Forensic psychologist. Dos de cada 100 adolescentes y Orthoptist de cada 100 adultos que enferman de pertusis deben ser hospitalizados, tienen complicaciones como la neumona o [place]. Estas enfermedades son provocadas por bacterias. La difteria y el pertusis se contagian de persona a persona a travs de la tos o el estornudo. El ttanos ingresa al organismo a travs de cortes, rasguos o heridas.  Antes de las vacunas, en los [place] se vieron ms de 200.000 casos al ao de difteria y tos [place] y cientos de casos de ttanos. Desde el inicio de la vacunacin, los casos de ttanos y difteria han disminuido alrededor del 99% y los casos de tos ferina alrededor del 80%.  Tdap  La vacuna Tdap protege a adolescentes y adultos contra el ttanos, la difteria y la tos [place]. Una dosis de Tdap se administra a los 11 o 12 aos de edad. Las [employer] no recibieron la vacuna Tdap a esa edad deben  recibirla tan pronto como sea posible.  Es muy importante que los profesionales de la salud y todos aquellos que tengan contacto cercano con bebs menores de 12 meses reciban la Tdap.  Las mujeres embarazadas deben recibir una dosis de Tdap en cada Psychiatrist, para proteger al recin nacido de la tos [place]. Los nios tienen mayor riesgo de complicaciones graves y potencialmente mortales debido a la tos [place].  Una vacuna similar, llamada Td, protege contra el ttanos y la difteria, pero no contra la tos [place]. Cada 10 aos debe recibirse un refuerzo de Td. La Tdap se puede administrar como uno de estos refuerzos, si todava no ha recibido una dosis. Tambin se puede aplicar despus de un corte o quemadura grave para prevenir la infeccin por ttanos.  El mdico le dar ms informacin.  La Tdap puede administrarse de manera segura simultneamente con otras vacunas.  ALGUNAS PERSONAS NO DEBEN RECIBIR ESTA VACUNA.   Si alguna vez tuvo una reaccin alrgica potencialmente mortal despus de Neomia Dear dosis de la vacuna contra el ttanos, la diferia o la tos [place], o tuvo una alergia grave a cualquiera de los componentes de esta vacuna, no debe aplicarse la vacuna. Informe a su mdico si usted sufre algn tipo de alergia grave.  Si estuvo en coma o sufri mltiples convulsiones dentro de los [address]  [address] posteriores despus de una dosis de DTP o DTaP no debe recibir la Tdap, salvo que se encuentre otra causa En este caso puede recibir la Td.  Consulte con  su mdico si:  tiene epilepsia u otra enfermedad del sistema nervioso,  siente dolor intenso o se hincha despus de recibir cualquier vacuna contra la difteria, el ttanos o la tos ferina,  alguna vez ha sufrido el sndrome de Guillain-Barr,  no se siente bien el da en que se ha programado la vacuna. RIESGOS DE UNA REACCIN A LA VACUNA Con cualquier medicamento, incluyendo las vacunas, existe la posibilidad de que aparezcan efectos secundarios. Estos son  leves y desaparecen por s solos, pero tambin son posibles las reacciones graves.  Breves episodios de desmayo pueden seguir a una vacunacin, causando lesiones por la cada. Sentarse o recostarse durante 15 minutos puede ayudar a evitarlo. Informe al mdico si se siente mareado o aturdido, tiene cambios en la visin o zumbidos en los odos.  Problemas leves luego de la Tdap (no interferirn con las actividades)   Dolor en el sitio de la inyeccin (alrededor de 1 de cada 4 adolescentes o 2 de cada 3 adultos).  Enrojecimiento o hinchazn en el lugar de la inyeccin (1 de cada 5 personas).  Fiebre leve de al menos 100,4 F (38 C) (hasta alrededor de 1 cada 25 adolescentes y 1 de cada 100 adultos).  Dolor de cabeza (3 o 4de cada 10 personas).  Cansancio (1 de cada 3 o 4 personas).  Nuseas, vmitos, diarrea, dolor de estmago (1 de cada 4 adolescentes o 1 de cada 10 adultos).  Escalofros, dolores corporales, dolor articular, erupciones, inflamacin de las glndulas (poco frecuente). Problemas moderados: (interfieren con las actividades, pero no requieren atencin mdica)   Dolor en el lugar de la inyeccin (1 de cada 5 adolescentes o 1 de cada 100 adultos).  Enrojecimiento o inflamacin (1 de cada 16 adolescentes y 1 de cada 25 adultos).  Fiebre de ms de 102F o 38,9C (1 de cada 100 adolescentes o 1 de cada 250 adultos).  Dolor de cabeza (alrededor de 4 de cada 20 adolescentes y 3 de cada 10 adultos).  Nuseas, vmitos, diarrea, dolor de estmago (1 a 3 de cada 100 personas).  Hinchazn de todo el brazo en el que se aplic la vacuna (3 de cada100 personas). Problemas graves: luego de la Tdap (no puede realizar las actividades habituales, requiere atencin mdica)   Inflamacin, dolor intenso, sangrado y enrojecimiento en el brazo, en el sitio de la inyeccin (poco frecuente). Una reaccin alrgica grave puede ocurrir despus de la administracin de cualquier vacuna (se estima en  menos de 1 en un milln de dosis).  QU PASA SI HAY UNA REACCIN GRAVE?  Qu signos debo buscar?  Observe todo lo que le preocupe, como signos de una reaccin alrgica grave, fiebre muy alta o cambios en el comportamiento. Los signos de una reaccin alrgica grave pueden incluir urticaria, hinchazn de la cara y la garganta, dificultad para respirar, ritmo cardaco acelerado, mareos y debilidad. Estos sntomas pueden comenzar entre unos pocos minutos y algunas horas despus de la vacunacin.  Qu debo hacer?  Si usted piensa que se trata de una reaccin alrgica grave o de otra emergencia que no puede esperar, llame al 911 o lleve a la persona al hospital ms cercano. De lo contrario, llame a su mdico.  Despus, la reaccin debe informarse a la "Vaccine Adverse Event Reporting System" (Sistema de informacin sobre efectos adversos de las vacunas -VAERS). El mdico o usted mismo pueden realizar el informe en el sitio web del VAERS www.vaers.hhs.govo llame al 1-800-822-7967. El VAERS es slo para informar reacciones. No   a su médico.  · Después, la reacción debe informarse a la "Vaccine Adverse Event Reporting System" (Sistema de información sobre efectos adversos de las vacunas -VAERS). El médico o usted mismo pueden realizar el informe en el sitio web del VAERS www.vaers.hhs.govo llame al 1-800-822-7967.  El VAERS es sólo para informar reacciones. No brindan consejo médico.   PROGRAMA NACIONAL DE COMPENSACIÓN DE DAÑOS POR VACUNAS   El National Vaccine Injury Compensation Program (VICP) es un programa federal que fue creado para compensar a las personas que puedan haber sufrido daños al recibir ciertas vacunas.   Aquellas personas que consideren que han sufrido un daño como consecuencia de una vacuna y quieren saber más acerca del programa y como presentar una denuncia, pueden llamar 1-800-338-2382 o visite el sitio web del VICP en www.hrsa.gov/vaccinecompensation.   ¿CÓMO PUEDO OBTENER MÁS INFORMACIÓN?   · Consulte a su médico.  · Comuníquese con el servicio de salud de su localidad o su estado.  · Comuníquese con los Centros para el control y la prevención de enfermedades (Centers for Disease Control and Prevention , CDC).  · llamando al 1-800-232-4636 o visitando el sitio web del CDC en www.cdc.gov/vaccines.  CDC Tdap Vaccine VIS  (12/22/11)   Document Released: 07/18/2012  Exit[employer]® Patient Information ©2014 [employer].

## 2013-03-28 NOTE — Progress Notes (Signed)
Bethany Sheppard Apr 18, 1971 [id number]   History:    42 y.o.  for annual gyn exam with no complaints today. Review of patient's records indicated that in the past she had been followed by she has not followed up with her in quite some time. She states that her cycles are regular. She denies any prior history of abnormal Pap smear.  Past medical history,surgical history, family history and social history were all reviewed and documented in the EPIC chart.  Gynecologic History Patient's last menstrual period was 03/03/2013. Contraception: tubal ligation Last Pap: 2013. Results were: normal Last mammogram: 2013. Results were: normal  Obstetric History OB History  Gravida Para Term Preterm AB SAB TAB Ectopic Multiple Living  2 2 2       2     # Outcome Date GA Lbr Len/2nd Weight Sex Delivery Anes PTL Lv  2 TRM     F SVD   Y  1 TRM     F SVD  N Y       ROS: A ROS was performed and pertinent positives and negatives are included in the history.  GENERAL: No fevers or chills. HEENT: No change in vision, no earache, sore throat or sinus congestion. NECK: No pain or stiffness. CARDIOVASCULAR: No chest pain or pressure. No palpitations. PULMONARY: No shortness of breath, cough or wheeze. GASTROINTESTINAL: No abdominal pain, nausea, vomiting or diarrhea, melena or bright red blood per rectum. GENITOURINARY: No urinary frequency, urgency, hesitancy or dysuria. MUSCULOSKELETAL: No joint or muscle pain, no back pain, no recent trauma. DERMATOLOGIC: No rash, no itching, no lesions. ENDOCRINE: No polyuria, polydipsia, no heat or cold intolerance. No recent change in weight. HEMATOLOGICAL: No anemia or easy bruising or bleeding. NEUROLOGIC: No headache, seizures, numbness, tingling or weakness. PSYCHIATRIC: No depression, no loss of interest in normal activity or change in sleep pattern.     Exam: chaperone present  BP 116/72  Ht 5\' 3"  (1.6 m)  Wt 131 lb (59.421 kg)  BMI 23.21 kg/m2  LMP  03/03/2013  Body mass index is 23.21 kg/(m^2).  General appearance : Well developed well nourished female. No acute distress HEENT: Neck supple, trachea midline, no carotid bruits, no thyroidmegaly Lungs: Clear to auscultation, no rhonchi or wheezes, or rib retractions  Heart: Regular rate and rhythm, no murmurs or gallops Breast:Examined in sitting and supine position were symmetrical in appearance. Left breast 1 1/2-2 cm mobile irregular tender mass 3 fingerbreadth from areolar region at 7 o'clock position. Contralateral breast w/o masses. There as no supraclavicular or axillary lymphadenopathy.  Abdomen: no palpable masses or tenderness, no rebound or guarding Extremities: no edema or skin discoloration or tenderness  Pelvic:  Bartholin, Urethra, Skene Glands: Within normal limits             Vagina: No gross lesions or discharge  Cervix: No gross lesions or discharge  Uterus  anteverted, normal size, shape and consistency, non-tender and mobile  Adnexa  Without masses or tenderness  Anus and perineum  normal   Rectovaginal  normal sphincter tone without palpated masses or tenderness             Hemoccult none indicated     Assessment/Plan:  42 y.o. female for annual exam who will be scheduled for a diagnostic mammogram as a result of the following:   Left breast 1 1/2-2 cm mobile irregular tender mass 3 fingerbreadth from areolar region at 7 o'clock position. Contralateral breast w/o masses. There as no supraclavicular or axillary  lymphadenopathy.   The following labs were ordered: CBC, screening cholesterol, hemoglobin A1c, urinalysis, thyroid function test, and vitamin D level so she was complaining of tiredness and muscle fatigue. She did receive the Tdap vaccine today.    Ok Edwards MD, 3:08 PM 03/28/2013

## 2013-03-29 ENCOUNTER — Telehealth: Payer: Self-pay | Admitting: *Deleted

## 2013-03-29 DIAGNOSIS — N6320 Unspecified lump in the left breast, unspecified quadrant: Secondary | ICD-10-CM

## 2013-03-29 LAB — URINALYSIS W MICROSCOPIC + REFLEX CULTURE
Bacteria, UA: NONE SEEN
Bilirubin Urine: NEGATIVE
Ketones, ur: NEGATIVE mg/dL
Specific Gravity, Urine: 1.022 (ref 1.005–1.030)

## 2013-03-29 LAB — THYROID PANEL WITH TSH: T4, Total: 8 ug/dL (ref 5.0–12.5)

## 2013-03-29 LAB — HEMOGLOBIN A1C: Mean Plasma Glucose: 108 mg/dL (ref <117)

## 2013-03-29 NOTE — Telephone Encounter (Signed)
Order placed at breast center, they will contact pt with time & date

## 2013-03-29 NOTE — Telephone Encounter (Signed)
Message copied by Aura Camps on Fri Mar 29, 2013  8:58 AM ------      Message from: Ok Edwards      Created: Thu Mar 28, 2013  3:05 PM       Victorino Dike please schedule diagnostic mammogram for this patient. The following was noted on breast exam today:                  Left breast 1 1/2-2 cm mobile irregular tender mass 3 fingerbreadth from areolar region at 7 o'clock position. Contralateral breast w/o masses. There as no supraclavicular or axillary lymphadenopathy.            Last study done at [facility] ------

## 2013-05-23 NOTE — Telephone Encounter (Signed)
Letter was sent by [facility] because pt voicemail is not set up to leave a message.

## 2013-06-07 ENCOUNTER — Telehealth: Payer: Self-pay | Admitting: *Deleted

## 2013-06-07 DIAGNOSIS — E559 Vitamin D deficiency, unspecified: Secondary | ICD-10-CM

## 2013-06-07 MED ORDER — ERGOCALCIFEROL 1.25 MG (50000 UT) PO CAPS: 50000.0000 [IU] | ORAL_CAPSULE | ORAL | Status: DC

## 2013-06-07 NOTE — Telephone Encounter (Signed)
Pt called back regarding lab results on OV 03/28/13. Pt informed with abnormal results, vitd x sent.

## 2013-06-20 ENCOUNTER — Ambulatory Visit
Admission: RE | Admit: 2013-06-20 | Discharge: 2013-06-20 | Disposition: A | Discharge status: Home / Self Care | Payer: Commercial Managed Care - PPO | Source: Ambulatory Visit | Attending: Gynecology | Admitting: Gynecology

## 2013-06-20 DIAGNOSIS — N6320 Unspecified lump in the left breast, unspecified quadrant: Secondary | ICD-10-CM

## 2013-07-17 ENCOUNTER — Other Ambulatory Visit: Payer: Self-pay

## 2013-07-17 ENCOUNTER — Other Ambulatory Visit: Payer: Self-pay | Admitting: Gynecology

## 2013-07-17 ENCOUNTER — Ambulatory Visit
Admission: RE | Admit: 2013-07-17 | Discharge: 2013-07-17 | Disposition: A | Discharge status: Home / Self Care | Payer: Commercial Managed Care - PPO | Source: Ambulatory Visit | Attending: Gynecology | Admitting: Gynecology

## 2013-07-17 ENCOUNTER — Ambulatory Visit: Admission: RE | Admit: 2013-07-17 | Payer: Commercial Managed Care - PPO | Source: Ambulatory Visit

## 2013-07-17 DIAGNOSIS — N6320 Unspecified lump in the left breast, unspecified quadrant: Secondary | ICD-10-CM

## 2013-09-24 ENCOUNTER — Other Ambulatory Visit: Payer: Self-pay | Admitting: Gynecology

## 2014-04-08 ENCOUNTER — Encounter: Payer: Commercial Managed Care - PPO | Admitting: Gynecology

## 2014-04-23 ENCOUNTER — Ambulatory Visit (INDEPENDENT_AMBULATORY_CARE_PROVIDER_SITE_OTHER): Payer: Commercial Managed Care - PPO | Admitting: Gynecology

## 2014-04-23 ENCOUNTER — Ambulatory Visit (INDEPENDENT_AMBULATORY_CARE_PROVIDER_SITE_OTHER): Payer: Commercial Managed Care - PPO

## 2014-04-23 ENCOUNTER — Other Ambulatory Visit: Payer: Self-pay | Admitting: Gynecology

## 2014-04-23 ENCOUNTER — Encounter: Payer: Self-pay | Admitting: Gynecology

## 2014-04-23 VITALS — BP 124/78 | Ht 63.0 in | Wt 137.0 lb

## 2014-04-23 DIAGNOSIS — Z8639 Personal history of other endocrine, nutritional and metabolic disease: Secondary | ICD-10-CM

## 2014-04-23 DIAGNOSIS — N898 Other specified noninflammatory disorders of vagina: Secondary | ICD-10-CM

## 2014-04-23 DIAGNOSIS — N760 Acute vaginitis: Secondary | ICD-10-CM

## 2014-04-23 DIAGNOSIS — A499 Bacterial infection, unspecified: Secondary | ICD-10-CM

## 2014-04-23 DIAGNOSIS — D259 Leiomyoma of uterus, unspecified: Secondary | ICD-10-CM

## 2014-04-23 DIAGNOSIS — R1900 Intra-abdominal and pelvic swelling, mass and lump, unspecified site: Secondary | ICD-10-CM

## 2014-04-23 DIAGNOSIS — N8300 Follicular cyst of ovary, unspecified side: Secondary | ICD-10-CM

## 2014-04-23 DIAGNOSIS — Z01419 Encounter for gynecological examination (general) (routine) without abnormal findings: Secondary | ICD-10-CM

## 2014-04-23 DIAGNOSIS — D251 Intramural leiomyoma of uterus: Secondary | ICD-10-CM

## 2014-04-23 DIAGNOSIS — B9689 Other specified bacterial agents as the cause of diseases classified elsewhere: Secondary | ICD-10-CM

## 2014-04-23 DIAGNOSIS — N839 Noninflammatory disorder of ovary, fallopian tube and broad ligament, unspecified: Secondary | ICD-10-CM | POA: Diagnosis not present

## 2014-04-23 LAB — URINALYSIS W MICROSCOPIC + REFLEX CULTURE
Bacteria, UA: NONE SEEN
Bilirubin Urine: NEGATIVE
Casts: NONE SEEN
GLUCOSE, UA: NEGATIVE mg/dL
Hgb urine dipstick: NEGATIVE
KETONES UR: NEGATIVE mg/dL
Leukocytes, UA: NEGATIVE
NITRITE: NEGATIVE
PH: 6.5 (ref 5.0–8.0)
Protein, ur: NEGATIVE mg/dL
Specific Gravity, Urine: 1.025 (ref 1.005–1.030)
Urobilinogen, UA: 0.2 mg/dL (ref 0.0–1.0)

## 2014-04-23 LAB — COMPREHENSIVE METABOLIC PANEL
ALT: 10 U/L (ref 0–35)
AST: 18 U/L (ref 0–37)
Albumin: 4.3 g/dL (ref 3.5–5.2)
Alkaline Phosphatase: 68 U/L (ref 39–117)
BILIRUBIN TOTAL: 0.2 mg/dL (ref 0.2–1.2)
BUN: 12 mg/dL (ref 6–23)
CO2: 24 mEq/L (ref 19–32)
Calcium: 9.1 mg/dL (ref 8.4–10.5)
Chloride: 104 mEq/L (ref 96–112)
Creat: 0.6 mg/dL (ref 0.50–1.10)
GLUCOSE: 85 mg/dL (ref 70–99)
POTASSIUM: 3.7 meq/L (ref 3.5–5.3)
Sodium: 140 mEq/L (ref 135–145)
Total Protein: 7.3 g/dL (ref 6.0–8.3)

## 2014-04-23 LAB — LIPID PANEL
CHOL/HDL RATIO: 2.5 ratio
CHOLESTEROL: 164 mg/dL (ref 0–200)
HDL: 66 mg/dL (ref >39)
LDL Cholesterol: 87 mg/dL (ref 0–99)
TRIGLYCERIDES: 55 mg/dL (ref <150)
VLDL: 11 mg/dL (ref 0–40)

## 2014-04-23 LAB — WET PREP FOR TRICH, YEAST, CLUE
Trich, Wet Prep: NONE SEEN
WBC WET PREP: NONE SEEN
YEAST WET PREP: NONE SEEN

## 2014-04-23 LAB — CBC WITH DIFFERENTIAL/PLATELET
BASOS ABS: 0 10*3/uL (ref 0.0–0.1)
BASOS PCT: 1 % (ref 0–1)
Eosinophils Absolute: 0.1 10*3/uL (ref 0.0–0.7)
Eosinophils Relative: 2 % (ref 0–5)
HCT: 33.2 % — ABNORMAL LOW (ref 36.0–46.0)
Hemoglobin: 10.3 g/dL — ABNORMAL LOW (ref 12.0–15.0)
Lymphocytes Relative: 31 % (ref 12–46)
Lymphs Abs: 1.5 10*3/uL (ref 0.7–4.0)
MCH: 24.6 pg — ABNORMAL LOW (ref 26.0–34.0)
MCHC: 31 g/dL (ref 30.0–36.0)
MCV: 79.2 fL (ref 78.0–100.0)
MONO ABS: 0.5 10*3/uL (ref 0.1–1.0)
MONOS PCT: 10 % (ref 3–12)
NEUTROS ABS: 2.6 10*3/uL (ref 1.7–7.7)
NEUTROS PCT: 56 % (ref 43–77)
Platelets: 214 10*3/uL (ref 150–400)
RBC: 4.19 MIL/uL (ref 3.87–5.11)
RDW: 17.9 % — AB (ref 11.5–15.5)
WBC: 4.7 10*3/uL (ref 4.0–10.5)

## 2014-04-23 LAB — TSH: TSH: 2.854 u[IU]/mL (ref 0.350–4.500)

## 2014-04-23 MED ORDER — MEDROXYPROGESTERONE ACETATE 150 MG/ML IM SUSP
150.0000 mg | Freq: Once | INTRAMUSCULAR | Status: AC
  Administered 2014-04-23: 150 mg via INTRAMUSCULAR

## 2014-04-23 MED ORDER — TINIDAZOLE 500 MG PO TABS
ORAL_TABLET | ORAL | Status: DC
Start: 2014-04-23 — End: 2014-09-18

## 2014-04-23 NOTE — Patient Instructions (Addendum)
Influenza Virus Vaccine (Flucelvax) Qu es este medicamento? La VACUNA ANTIGRIPAL ayuda a disminuir el riesgo de contraer la influenza, tambin conocida como la gripe. La vacuna solo ayuda a protegerle contra algunas cepas de influenza. Este medicamento puede ser utilizado para otros usos; si tiene alguna pregunta consulte con su proveedor de atencin mdica o con su farmacutico. MARCAS COMERCIALES DISPONIBLES: Haze Rushing le debo informar a mi profesional de la salud antes de tomar este medicamento? Necesita saber si usted presenta alguno de los siguientes problemas o situaciones: -trastorno de sangrado como hemofilia -fiebre o infeccin -sndrome de Guillain-Barre u otros problemas neurolgicos -problemas del sistema inmunolgico -infeccin por el virus de la inmunodeficiencia humana (VIH) o SIDA -niveles bajos de plaquetas en la sangre -esclerosis mltiple -una reaccin alrgica o inusual a las vacunas antigripales, a otros medicamentos, alimentos, colorantes o conservantes -si est embarazada o buscando quedar embarazada -si est amamantando a un beb Cmo debo utilizar este medicamento? Esta vacuna se administra mediante inyeccin por va intramuscular. Lo administra un profesional de KB Home	[place]. Recibir una copia de informacin escrita sobre la vacuna antes de cada vacuna. Asegrese de leer este folleto cada vez cuidadosamente. Este folleto puede cambiar con frecuencia. Hable con su pediatra para informarse acerca del uso de este medicamento en nios. Puede requerir atencin especial. Sobredosis: Pngase en contacto inmediatamente con un centro toxicolgico o una sala de urgencia si usted cree que haya tomado demasiado medicamento. ATENCIN: [employer] es solo para usted. No comparta este medicamento con nadie. Qu sucede si me olvido de una dosis? No se aplica en este caso. Qu puede interactuar con este medicamento? -quimioterapia o radioterapia -medicamentos que  suprimen el sistema inmunolgico, tales como etanercept, anakinra, infliximab y adalimumab -medicamentos que tratan o previenen cogulos sanguneos, como warfarina -fenitona -medicamentos esteroideos, como la prednisona o la cortisona -teofilina -vacunas Puede ser que esta lista no menciona todas las posibles interacciones. Informe a su profesional de KB Home	[place] de [employer] productos a base de hierbas, medicamentos de [place] o suplementos nutritivos que est tomando. Si usted fuma, consume bebidas alcohlicas o si utiliza drogas ilegales, indqueselo tambin a su profesional de KB Home	[place]. Algunas sustancias pueden interactuar con su medicamento. A qu debo estar atento al usar [employer]? Informe a su mdico o a Barrister's clerk de la [employer] todos los efectos secundarios que persistan despus de 3 das. Llame a su proveedor de atencin mdica si se presentan sntomas inusuales dentro de las 6 semanas de recibir esta vacuna. Es posible que todava pueda contraer la gripe, pero la enfermedad no ser tan fuerte como normalmente. No puede contraer la gripe de esta vacuna. La vacuna antigripal no le protege contra resfros u otras enfermedades que pueden causar [place]. Debe vacunarse cada ao. Qu efectos secundarios puedo tener al [employer] este medicamento? Efectos secundarios que debe informar a su mdico o a Barrister's clerk de la salud tan pronto como sea posible: -reacciones alrgicas como erupcin cutnea, picazn o urticarias, hinchazn de la cara, labios o lengua Efectos secundarios que, por lo general, no requieren atencin mdica (debe informarlos a su mdico o a su profesional de la salud si persisten o si son molestos): -fiebre -dolor de cabeza -molestias y dolores musculares -dolor, sensibilidad, enrojecimiento o Estate agent de la inyeccin -cansancio Puede ser que esta lista no menciona todos los posibles efectos secundarios. Comunquese a su mdico por  asesoramiento mdico [employer]. Usted puede informar los efectos secundarios a la FDA por  telfono al 1-800-FDA-1088. Dnde debo guardar mi medicina? Esta vacuna se administrar por un profesional de la salud en una [place], Engineer, mining, consultorio mdico u otro consultorio de un profesional de la salud. No se le suministrar esta vacuna para guardar en su domicilio. ATENCIN: Este folleto es un resumen. Puede ser que no cubra toda la posible informacin. Si usted tiene preguntas acerca de esta medicina, consulte con su mdico, su farmacutico o su profesional de Technical sales engineer.  2015, Elsevier/Gold Standard. (2011-07-18 16:29:16) Vaginosis bacteriana (Bacterial Vaginosis) La vaginosis bacteriana es una infeccin de la vagina. Se produce cuando una cantidad excesiva de ciertos grmenes (bacterias) crece en la vagina. [place] los medicamentos tal como se lo indic su mdico.  Finalice la prescripcin completa, aunque comience a sentirse mejor.  No mantenga relaciones sexuales hasta que finalice sus medicamentos y se International aid/development worker.  Comunique a sus compaeros sexuales que sufre una infeccin. Deben consultar a su mdico para iniciar un tratamiento.  Practique el sexo seguro. Use preservativos. Tenga solo un compaero sexual. SOLICITE AYUDA SI:  No mejora luego de 3 das de [place].  Observa una secrecin (prdida) de color gris ms abundante que proviene de la vagina.  Siente ms dolor que antes.  Tiene fiebre. ASEGRESE DE QUE:   Comprende estas instrucciones.  Controlar su afeccin.  Recibir ayuda de inmediato si no mejora o si empeora. Document Released: 10/28/2008 Document Revised: 05/22/2013 [facility] Patient Information 2015 [place]. This information is not intended to replace advice given to you by your health care provider. Make sure you discuss any questions you have with your health care provider. Quiste ovrico (Ovarian  Cyst) Un quiste ovrico es una bolsa llena de lquido que se forma en el ovario. Los ovarios son los rganos pequeos que producen vulos en las mujeres. Se pueden formar varios tipos de [employer]. Benito Mccreedy no son cancerosos. Muchos de ellos no causan problemas y con frecuencia desaparecen solos. Algunos pueden provocar sntomas y requerir Clinical research associate. Los tipos ms comunes de quistes ovricos son los siguientes:  Quistes funcionales: estos quistes pueden aparecer todos los meses durante el ciclo menstrual. Esto es normal. Estos quistes suelen desaparecer con el prximo ciclo menstrual si la mujer no queda embarazada. En general, los quistes funcionales no tienen sntomas.  Endometriomas: estos quistes se forman a partir del tejido que recubre el tero. Tambin se denominan "quistes de chocolate" porque se llenan de sangre que se vuelve marrn. Este tipo de quiste puede Engineer, production en la zona inferior del abdomen durante la relacin sexual y con el perodo menstrual.  Cistoadenomas: este tipo se desarrolla a partir de las clulas que se [place] en el exterior del ovario. Estos quistes pueden ser muy grandes y causar dolor en la zona inferior del abdomen y durante la relacin sexual. Cindra Presume tipo de quiste puede girar sobre s mismo, cortar el suministro de Biochemist, clinical y causar un dolor intenso. Tambin se puede romper con facilidad y Stage manager.  Quistes dermoides: este tipo de quiste a veces se encuentra en ambos ovarios. Estos quistes pueden [employer] tipos de tejidos del organismo, como piel, dientes, pelo o Database administrator. Generalmente no tienen sntomas, a menos que sean muy grandes.  Quistes tecalutenicos: aparecen cuando se produce demasiada cantidad de cierta hormona (gonadotropina corinica humana) que estimula en exceso al ovario para que produzca vulos. Esto es ms frecuente despus de procedimientos que ayudan a la concepcin de un beb (fertilizacin in  vitro).  CAUSAS   Los medicamentos para la fertilidad pueden provocar una afeccin mediante la cual se forman mltiples quistes de gran tamao en los ovarios. Esta se denomina sndrome de hiperestimulacin ovrica.  El sndrome del ovario poliqustico es una afeccin que puede causar desequilibrios hormonales, los cuales pueden dar como resultado quistes ovricos no funcionales. SIGNOS Y SNTOMAS  Muchos quistes ovricos no causan sntomas. Si se presentan sntomas, stos pueden ser:  Dolor o molestias en la pelvis.  Dolor en la parte baja del abdomen.  [place].  Aumento del permetro abdominal (hinchazn).  Perodos menstruales anormales.  Aumento del [employer] perodos [place].  Cese de los perodos menstruales sin estar embarazada. DIAGNSTICO  Estos quistes se descubren comnmente durante un examen de rutina o una exploracin ginecolgica anual. Es posible que se ordenen otros estudios para obtener ms informacin sobre el [place]. Estos estudios pueden ser:  Engineer, materials.  Radiografas de la pelvis.  Tomografa computada.  Resonancia magntica.  Anlisis de [place]. TRATAMIENTO  Muchos de los quistes ovricos desaparecen por s solos, sin tratamiento. Es probable que el mdico quiera controlar el quiste regularmente durante 2 o 17mses para ver si se produce algn cambio. En el caso de las mujeres en la menopausia, es particularmente importante controlar de cerca al quiste ya que el ndice de cncer de ovario en las mujeres menopusicas es ms alto. Cuando se requiere tClinical research associate este puede incluir cualquiera de los siguientes:  Un procedimiento para drenar el quiste (aspiracin). Esto se puede realizar mFamily Dollar Storesuso de uGuamgrande y uGold[place] Tambin se puede hacer a travs de un procedimiento laparoscpico, En este procedimiento, se inserta un tubo delgado que emite luz y que tiene una pequea cmara en un extremo (laparoscopio)  a travs de una pequea incisin.  Ciruga para extirpar el quiste completo. Esto se puede realizar mediante una ciruga laparoscpica o uArdelia Memsciruga abierta, la cual implica realizar una incisin ms grande en la parte inferior del abdomen.  Tratamiento hormonal o pldoras anticonceptivas. Estos mtodos a veces se usan para ayudar a dWriter IBeulahsolo medicamentos de venta libre o recetados, segn las indicaciones del mdico.  CConsulting civil engineera las consultas de control con su mdico segn las indicaciones.  Hgase exmenes plvicos regulares y pruebas de [place]. SOLICITE ATENCIN MDICA SI:   Los perodos se atrasan, son irregulares, dolorosos o cesan.  El dolor plvico o abdominal no desaparece.  El abdomen se agranda o se hincha.  Siente presin en la vejiga o no puede vaciarla completamente.  Siente dolor durante las rOffice Depot  Tiene una sensacin de hinchazn, presin o mManufacturing systems engineer  Pierde peso sin razn aparente.  Siente un mPharmacist, hospital  Est estreida.  Pierde el apetito.  Le aparece acn.  Nota un aumento del vello corporal y facial.  AElenore Rotade peso sin hacer modificaciones en su actividad fsica y en su dieta habitual.  Sospecha que est embarazada. SOLICITE ATENCIN MDICA DE INMEDIATO SI:   Siente cada vez ms dolor abdominal.  Tiene malestar estomacal (nuseas) y vomita.  Tiene fiebre que se presenta de mRavennarepentina.  Siente dolor abdominal al defecar.  Sus perodos menstruales son ms abundantes que lo habitual. ASEGRESE DE QUE:   Comprende estas instrucciones.  Controlar su afeccin.  Recibir ayuda de inmediato si no mejora o si empeora. Document Released: 05/11/2005 Document Revised: 08/06/2013 [facility] 2015 [place] This  information is not intended to replace advice given to you by your health care provider. Make sure  you discuss any questions you have with your health care provider. Tinidazole tablets Qu es este medicamento? El TINIDAZOL es un medicamento antiinfeccioso. Se utiliza para tratar la amebiasis, giardiasis, tricomonosis y vaginosis. No es efectivo para resfros, gripe u otras infecciones de origen viral. Este medicamento puede ser utilizado para otros usos; si tiene alguna pregunta consulte con su proveedor de atencin mdica o con su farmacutico. MARCAS COMERCIALES DISPONIBLES: [place] Qu le debo informar a mi profesional de la salud antes de tomar este medicamento? Necesita saber si usted presenta alguno de los siguientes problemas o situaciones: -anemia u otros trastornos sanguneos -si consume bebidas alcohlicas con frecuencia -recibe hemodilisis -trastorno de convulsiones -una reaccin alrgica o inusual al tinidazol, a otros medicamentos, alimentos, colorantes o conservantes -si est embarazada o buscando quedar embarazada -si est amamantando a un beb Cmo debo utilizar este medicamento? Tome este medicamento por va oral con un vaso lleno de agua. Siga las instrucciones de la etiqueta del [place]. Tomar con alimentos. Tome sus dosis a intervalos regulares. No tome su medicamento con una frecuencia mayor a la indicada. Complete todas las dosis de su medicamento como se le haya indicado aun si se siente mejor. No omita ninguna dosis o suspenda el uso de su medicamento antes de lo indicado. Hable con su pediatra para informarse acerca del uso de este medicamento en nios. Aunque este medicamento ha sido recetado a nios tan menores como de 3 aos de edad para condiciones selectivas, las precauciones se aplican. Sobredosis: Pngase en contacto inmediatamente con un centro toxicolgico o una sala de urgencia si usted cree que haya tomado demasiado medicamento. ATENCIN: [employer] es solo para usted. No comparta este medicamento con nadie. Qu sucede si me olvido de una  dosis? Si olvida una dosis, tmela lo antes posible. Si es casi la hora de la prxima dosis, tome slo esa dosis. No tome dosis adicionales o dobles. Qu puede interactuar con este medicamento? No tome esta medicina con ninguno de los siguientes medicamentos: -alcohol o cualquier producto que contenga alcohol -solucin oral de amprenavir -disulfiram -inyeccin de paclitaxel -solucin oral de ritonavir -solucin oral de sertralina -inyeccin de sulfametoxasol-trimetoprima Esta medicina tambin puede interactuar con los siguientes medicamentos: -colestiramina -cimetidina -conivaptn -ciclosporina -fluorouracilo -fosfenitona, fenitona -quetoconazol -litio -fenobarbital -tacrolimo -warfarina Puede ser que esta lista no menciona todas las posibles interacciones. Informe a su profesional de KB Home	[place] de [employer] productos a base de hierbas, medicamentos de [place] o suplementos nutritivos que est tomando. Si usted fuma, consume bebidas alcohlicas o si utiliza drogas ilegales, indqueselo tambin a su profesional de KB Home	[place]. Algunas sustancias pueden interactuar con su medicamento. A qu debo estar atento al usar Coca-Cola? Si los sntomas no mejoran o si empeoran, consulte con su mdico o con su profesional de KB Home	[place]. Evite consumir las bebidas alcohlicas mientras toma este medicamento y durante tres das despus. El alcohol puede hacerle sentir mareado, enfermo o enrojecimiento. Si est recibiendo tratamiento para [place] enfermedad de transmisin sexual, evite todo contacto sexual hasta que haya terminado el [place]. Es posible que su pareja tambin necesite [place]. Qu efectos secundarios puedo tener al [employer] este medicamento? Efectos secundarios que debe informar a su mdico o a Barrister's clerk de la salud tan pronto como sea posible: -reacciones alrgicas como erupcin cutnea, picazn o urticarias, hinchazn de la cara, labios o lengua -problemas  respiratorios -confusin, depresin -manchas  oscuras o blancas en la boca -sensacin de desmayos o mareos, cadas -fiebre, infeccin -entumecimiento, hormigueo, dolor o debilidad en las manos o pies -dolor al orinar -convulsiones -cansancio o debilidad inusual -irritacin o flujo vaginal -vmito Efectos secundarios que, por lo general, no requieren atencin mdica (debe informarlos a su mdico o a su profesional de la salud si persisten o si son molestos): -orina de color marrn oscuro o rojo -diarrea -dolor de cabeza -prdida del apetito -sabor metlico -nuseas -Higher education careers adviser Puede ser que esta lista no menciona todos los posibles efectos secundarios. Comunquese a su mdico por asesoramiento mdico [employer]. Usted puede informar los efectos secundarios a la FDA por telfono al 1-800-FDA-1088. Dnde debo guardar mi medicina? Mantngala fuera del alcance de los nios. Gurdela a [employer], entre 15 y 20 grados C (65 y 32 grados F). Protjala de la luz y de la humedad. Mantenga el envase bien cerrado. Deseche todo el medicamento que no haya utilizado, despus de la fecha de vencimiento. ATENCIN: Este folleto es un resumen. Puede ser que no cubra toda la posible informacin. Si usted tiene preguntas acerca de esta medicina, consulte con su mdico, su farmacutico o su profesional de Technical sales engineer.  2015, Elsevier/Gold Standard. (2007-03-11 16:39:00) Vaginosis bacteriana (Bacterial Vaginosis) La vaginosis bacteriana es una infeccin vaginal que perturba el equilibrio normal de las bacterias que se encuentran en la vagina. Es el resultado de un crecimiento excesivo de ciertas bacterias. Esta es la infeccin vaginal ms frecuente en mujeres en edad reproductiva. El tratamiento es importante para prevenir complicaciones, especialmente en mujeres embarazadas, dado que puede causar un parto prematuro. CAUSAS  La vaginosis bacteriana se origina por un aumento  de bacterias nocivas que, generalmente, estn presentes en cantidades ms pequeas en la vagina. Varios tipos diferentes de bacterias pueden causar esta afeccin. Sin embargo, la causa de su desarrollo no se comprende totalmente. [place] o comportamientos pueden exponerlo a un mayor riesgo de desarrollar vaginosis bacteriana, entre los que se incluyen:  Tener una nueva pareja sexual o mltiples parejas sexuales.  Las duchas vaginales  El uso del DIU (dispositivo intrauterino) como mtodo anticonceptivo. El contagio no se produce en baos, por ropas de cama, en piscinas o por contacto con objetos. SIGNOS Y SNTOMAS  Algunas mujeres que padecen vaginosis bacteriana no presentan signos ni sntomas. Los sntomas ms comunes son:  Secrecin vaginal de color grisceo.  Secrecin vaginal con olor similar al [employer], especialmente despus de Retail banker.  Picazn o sensacin de ardor en la vagina o la vulva.  Ardor o dolor al [employer]. DIAGNSTICO  Su mdico analizar su historia clnica y le examinar la vagina para detectar signos de vaginosis bacteriana. Puede tomarle Truddie Coco de flujo vaginal. Su mdico examinar esta muestra con un microscopio para controlar las bacterias y clulas anormales. Tambin puede realizarse un anlisis del pH vaginal.  TRATAMIENTO  La vaginosis bacteriana puede tratarse con antibiticos, en forma de comprimidos o de crema vaginal. Puede indicarse una segunda tanda de antibiticos si la afeccin se repite despus del tratamiento.  [place] solo medicamentos de venta libre o recetados, segn las indicaciones del mdico.  Si le han recetado antibiticos, tmelos como se le indic. Asegrese de que finaliza la prescripcin completa aunque se sienta mejor.  No mantenga relaciones sexuales Animator.  Comunique a sus compaeros sexuales que sufre una  infeccin vaginal. Deben consultar a su mdico  y recibir tratamiento si tienen problemas, como picazn o una erupcin cutnea leve.  Practique el sexo seguro usando preservativos y tenga un nico compaero sexual. SOLICITE ATENCIN MDICA SI:   Sus sntomas no mejoran despus de 3 das de [place].  Aumenta la secrecin o Conservation officer, historic buildings.  Tiene fiebre. ASEGRESE DE QUE:   Comprende estas instrucciones.  Controlar su afeccin.  Recibir ayuda de inmediato si no mejora o si empeora. PARA OBTENER MS INFORMACIN  Centros para el control y la prevencin de Probation officer for Disease Control and Prevention, CDC): AppraiserFraud.fi Asociacin Estadounidense de la Salud Sexual (American Sexual Health Association, SHA): www.ashastd.org  Document Released: 11/08/2007 Document Revised: 05/22/2013 [facility] Patient Information 2015 [place], [place]. This information is not intended to replace advice given to you by your health care provider. Make sure you discuss any questions you have with your health care provider.

## 2014-04-23 NOTE — Progress Notes (Signed)
Bethany Sheppard 21-Feb-1971 [id number]   History:    43 y.o.  presented to the office today for her annual gynecological exam. Patient's only complaint today he has been a vaginal odor no true discharge. Patient in a monogamous relationship. Patient reports normal menstrual cycles. Patient with no past history of abnormal Pap smears.   Past medical history,surgical history, family history and social history were all reviewed and documented in the EPIC chart.  Gynecologic History Patient's last menstrual period was 04/05/2014. Contraception: tubal ligation Last Pap: 2013. Results were: normal Last mammogram: November 2014. Results were: normal  Obstetric History OB History  Gravida Para Term Preterm AB SAB TAB Ectopic Multiple Living  2 2 2       2     # Outcome Date GA Lbr Len/2nd Weight Sex Delivery Anes PTL Lv  2 TRM     F SVD   Y  1 TRM     F SVD  N Y       ROS: A ROS was performed and pertinent positives and negatives are included in the history.  GENERAL: No fevers or chills. HEENT: No change in vision, no earache, sore throat or sinus congestion. NECK: No pain or stiffness. CARDIOVASCULAR: No chest pain or pressure. No palpitations. PULMONARY: No shortness of breath, cough or wheeze. GASTROINTESTINAL: No abdominal pain, nausea, vomiting or diarrhea, melena or bright red blood per rectum. GENITOURINARY: No urinary frequency, urgency, hesitancy or dysuria. MUSCULOSKELETAL: No joint or muscle pain, no back pain, no recent trauma. DERMATOLOGIC: No rash, no itching, no lesions. ENDOCRINE: No polyuria, polydipsia, no heat or cold intolerance. No recent change in weight. HEMATOLOGICAL: No anemia or easy bruising or bleeding. NEUROLOGIC: No headache, seizures, numbness, tingling or weakness. PSYCHIATRIC: No depression, no loss of interest in normal activity or change in sleep pattern.     Exam: chaperone present  BP 124/78  Ht 5\' 3"  (1.6 m)  Wt 137 lb (62.143 kg)  BMI  24.27 kg/m2  LMP 04/05/2014  Body mass index is 24.27 kg/(m^2).  General appearance : Well developed well nourished female. No acute distress HEENT: Neck supple, trachea midline, no carotid bruits, no thyroidmegaly Lungs: Clear to auscultation, no rhonchi or wheezes, or rib retractions  Heart: Regular rate and rhythm, no murmurs or gallops Breast:Examined in sitting and supine position were symmetrical in appearance, no palpable masses or tenderness,  no skin retraction, no nipple inversion, no nipple discharge, no skin discoloration, no axillary or supraclavicular lymphadenopathy Abdomen: no palpable masses or tenderness, no rebound or guarding Extremities: no edema or skin discoloration or tenderness  Pelvic:  Bartholin, Urethra, Skene Glands: Within normal limits             Vagina: Creamy foul smelling discharge  Cervix: No gross lesions or discharge  Uterus  anteverted, normal size, shape and consistency, non-tender and mobile  Adnexa fullness right adnexa   Anus and perineum  normal   Rectovaginal  normal sphincter tone without palpated masses or tenderness             Hemoccult indicated   Wet prep: Amine Positive, clue cells many, bacteria too numerous to count  Ultrasound: Uterus measures 9.3 x 5.8 x 4.3 cm with endometrial stripe of 10.2 mm. An intramural myoma measuring 19 x 21 mm was described as displacing the endometrial cavity posteriorly. Patient had a right adnexal cystic mass with dimensions of 6.5 x 4.5 x 7.3 cm average size 6.1 cm echo-free area measuring  4.8 x 5.1 mm thickened area measuring 3.5 x 3.1 reticular echo pattern with positive color flow noted at the periphery. Arterial blood flow to the other was noted minimal fluid in the cul-de-sac. Left ovary was reported to be normal.  Assessment/Plan:  43 y.o. female for annual exam with incidental finding of a right adnexal fullness. Ultrasound today demonstrated a 6 cm right ovarian cyst possibly hemorrhagic cyst.  Patient will have a CA 125 drawn today and she will receive an injection of Depo-Provera 150 mg IM to see if this will suppress the cyst and she will return back to the office in 2 months for followup ultrasound. Wet prep with clinical evidence of bacterial vaginosis. Patient will be prescribed Tindamax 500 mg tablet she is to take 4 tablets today repeat in 24 hours. Patient will then use probiotic vaginal gel Rephresh twice a week. The following labs were ordered today: CBC, comprehensive metabolic panel, TSH, CBC, fasting lipid profile and urinalysis. Pap smear not done.    Note: This dictation was prepared with  Dragon/digital dictation along withSmart phrase technology. Any transcriptional errors that result from this process are unintentional.   Terrance Mass MD, 9:15 AM 04/23/2014

## 2014-04-24 ENCOUNTER — Other Ambulatory Visit: Payer: Self-pay | Admitting: Gynecology

## 2014-04-24 DIAGNOSIS — R710 Precipitous drop in hematocrit: Secondary | ICD-10-CM

## 2014-04-24 LAB — CA 125: CA 125: 18 U/mL (ref <35)

## 2014-04-24 LAB — VITAMIN D 25 HYDROXY (VIT D DEFICIENCY, FRACTURES): VIT D 25 HYDROXY: 33 ng/mL (ref 30–89)

## 2014-06-13 ENCOUNTER — Encounter: Payer: Self-pay | Admitting: Gynecology

## 2014-06-13 ENCOUNTER — Other Ambulatory Visit: Payer: Self-pay | Admitting: Gynecology

## 2014-06-13 ENCOUNTER — Ambulatory Visit (INDEPENDENT_AMBULATORY_CARE_PROVIDER_SITE_OTHER): Payer: Commercial Managed Care - PPO

## 2014-06-13 ENCOUNTER — Ambulatory Visit (INDEPENDENT_AMBULATORY_CARE_PROVIDER_SITE_OTHER): Payer: Commercial Managed Care - PPO | Admitting: Gynecology

## 2014-06-13 VITALS — BP 118/76

## 2014-06-13 DIAGNOSIS — R710 Precipitous drop in hematocrit: Secondary | ICD-10-CM

## 2014-06-13 DIAGNOSIS — D649 Anemia, unspecified: Secondary | ICD-10-CM

## 2014-06-13 DIAGNOSIS — N8300 Follicular cyst of ovary, unspecified side: Secondary | ICD-10-CM

## 2014-06-13 DIAGNOSIS — N83201 Unspecified ovarian cyst, right side: Secondary | ICD-10-CM

## 2014-06-13 DIAGNOSIS — D509 Iron deficiency anemia, unspecified: Secondary | ICD-10-CM

## 2014-06-13 DIAGNOSIS — N939 Abnormal uterine and vaginal bleeding, unspecified: Secondary | ICD-10-CM

## 2014-06-13 DIAGNOSIS — N923 Ovulation bleeding: Secondary | ICD-10-CM

## 2014-06-13 DIAGNOSIS — D251 Intramural leiomyoma of uterus: Secondary | ICD-10-CM

## 2014-06-13 DIAGNOSIS — R938 Abnormal findings on diagnostic imaging of other specified body structures: Secondary | ICD-10-CM

## 2014-06-13 DIAGNOSIS — R9389 Abnormal findings on diagnostic imaging of other specified body structures: Secondary | ICD-10-CM

## 2014-06-13 DIAGNOSIS — N8320 Unspecified ovarian cysts: Secondary | ICD-10-CM

## 2014-06-13 NOTE — Progress Notes (Signed)
   Patient is a 43 year old who was seen in the office on September 9 for her annual gynecological examination. An incidental finding of a right adnexal fullness was noted and an ultrasound was ordered with the following findings noted:  Ultrasound: Uterus measures 9.3 x 5.8 x 4.3 cm with endometrial stripe of 10.2 mm. An intramural myoma measuring 19 x 21 mm was described as displacing the endometrial cavity posteriorly. Patient had a right adnexal cystic mass with dimensions of 6.5 x 4.5 x 7.3 cm average size 6.1 cm echo-free area measuring 4.8 x 5.1 mm thickened area measuring 3.5 x 3.1 reticular echo pattern with positive color flow noted at the periphery. Arterial blood flow to the other was noted minimal fluid in the cul-de-sac. Left ovary was reported to be normal.   A CA-125 was ordered which was 18. She received Depo-Provera 150 mg at that time and was to return in 3 months for follow-up ultrasound.  Patient presented today stating that she had not had a menstrual cycle in September and not until October 15 she began spotting on and off for 2 weeks until yesterday. She was otherwise asymptomatic.  Exam: Abdomen: Soft nontender no rebound or guarding Pelvic: Bartholin urethra Skene was within normal limits Vagina: No lesions or discharge Cervix: No lesions or discharge Uterus: Anteverted normal size shape and consistency Adnexa: No palpable masses or tenderness Rectal exam not done  Ultrasound today: Thickened endometrium. Right ovarian rim of tissue thinwall primarily echo-free cyst measuring 5.3 x 5.0 x 5.3 cm average size 5.2 cm was 6.1 cm 2 months ago. Since his avascular, tiny echogenic debris floating in the cyst. Left ovarian follicle measured 3.0 x 2.0 cm negative fluid in cul-de-sac.  Assessment/plan: Right ovarian cyst has decreased in size from 6.1 cm to 5.2 cm over the course of the past 2 months since patient received Depo-Provera injection. She had a normal C1 25 2 months  ago repeated again today along with her CBC because of her anemia. The spotting that she has had along with her amenorrhea is attributed to the effects of the Depo-Provera. Patient was reassured. Patient would like to wait a few more months 40 surgical intervention. She'll return back to the office in 3 months for follow-up ultrasound.

## 2014-06-13 NOTE — Patient Instructions (Signed)
Medroxyprogesterone injection [Contraceptive] Qu es este medicamento? Las inyecciones anticonceptivas de MEDROXIPROGESTERONA previenen Water quality scientist. Las [employer] brindarn control de la natalidad durante 3 meses. La Depo-subQ Provera 104 se utiliza tambin para tratar [employer] relacionado con endometriosis. Este medicamento puede ser utilizado para otros usos; si tiene alguna pregunta consulte con su proveedor de atencin mdica o con su farmacutico. MARCAS COMERCIALES DISPONIBLES: Depo-Provera, Depo-subQ Provera 104 Qu le debo informar a mi profesional de la salud antes de tomar este medicamento? Necesita saber si usted presenta alguno de los siguientes problemas o situaciones: -si consume alcohol con frecuencia -asma -enfermedad vascular o antecedente de cogulos sanguneos en los pulmones o las piernas -enfermedad de los [place], como osteoporosis -cncer de mama -diabetes -trastornos de la alimentacin (anorexia nerviosa o bulimia) -alta presin sangunea -infecciones por VIH o SIDA -enfermedad renal -enfermedad heptica -depresin mental -migraa -convulsiones -derrame cerebral -fuma tabaco -sangrado vaginal -una reaccin alrgica o inusual a la medroxiprogesterona, otras hormonas, otros medicamentos, alimentos, colorantes o conservantes -si est embarazada o buscando quedar embarazada -si est amamantando a un beb Cmo debo utilizar este medicamento? El anticonceptivo de Depo-Provera se inyecta por va intramuscular. La Depo-SubQ Provera 104 se inyecta por va subcutnea. Las [employer] un profesional de Technical sales engineer. Usted no puede estar embarazada antes de recibir una inyeccin. La inyeccin normalmente se aplica durante los primeros 5 das despus de comenzar un perodo menstrual o 6 semanas despus de un parto. Hable con su pediatra para informarse acerca del uso de este medicamento en nios. Puede requerir atencin especial. Estas inyecciones han sido usadas en nias  que han empezados a tener perodos [place]. Sobredosis: Pngase en contacto inmediatamente con un centro toxicolgico o una sala de urgencia si usted cree que haya tomado demasiado medicamento. ATENCIN: [employer] es solo para usted. No comparta este medicamento con nadie. Qu sucede si me olvido de una dosis? Trate de no olvidar ninguna dosis. Para mantener el control de natalidad necesitar una inyeccin cada 3 meses. Si no puede asistir a una cita, comunquese con su profesional de la salud para que se la [place]. Si espera ms de 13 semanas entre las inyecciones anticonceptivos de Depo-Provera o ms de 14 semanas entre inyecciones anticonceptivos de Depo-subQ Provera 104, puede quedarse [place]. Si no puede asistir a su cita utilice otro mtodo anticonceptivo. Tal vez deba hacerse una prueba de embarazo antes de recibir otra inyeccin. Qu puede interactuar con este medicamento? No tome esta medicina con ninguno de los siguientes medicamentos: -bosentano Esta medicina tambin puede interactuar con los siguientes medicamentos: -aminoglutethimide -antibiticos o medicamentos para infecciones, especialmente rifampicina, rifabutina, rifapentina y griseofulvina -aprepitant -barbitricos, tales como el fenobarbital o primidona -bexaroteno -carbamazepina -medicamentos para convulsiones, tales como etotona, felbamato, [place], [place], topiramato -modafinilo -hierba de [place] Puede ser que esta lista no menciona todas las posibles interacciones. Informe a su profesional de [employer] Home	[place] de [employer] productos a base de hierbas, medicamentos de [place] o suplementos nutritivos que est tomando. Si usted fuma, consume bebidas alcohlicas o si utiliza drogas ilegales, indqueselo tambin a su profesional de KB Home	[place]. Algunas sustancias pueden interactuar con su medicamento. A qu debo estar atento al usar [employer]? Este medicamento no la protege de la infeccin por VIH  (SIDA) ni de otras enfermedades de transmisin sexual. El uso de este producto puede provocar una prdida de calcio de sus huesos. La prdida de calcio puede provocar huesos dbiles (osteoporosis). Slo use este producto durante ms de 2 aos si otras formas  de anticonceptivos no son apropiados para usted. Mientre ms tiempo use este producto para el control de la natalidad, tendr ms riesgo de Insurance risk surveyor de [employer]. Consulte a su profesional de Pharmacist, hospital de cmo puede [employer]xon [employer] Corporation fuertes. Puede experimentar un cambio en el patrn de sangrado o periodos irregulares. Muchas mujeres dejan de tener periodos [employer]. Si recibe sus inyecciones a tiempo, la posibilidad de quedarse embarazada es muy baja. Si cree que podr [employer], visite a su profesional de la salud lo antes posible. Si desea quedar embarazada dentro del prximo ao, informe a su profesional de KB Home	[place]. El [place] de este medicamento puede perdurar durante mucho tiempo despus de recibir su ltima inyeccin. Qu efectos secundarios puedo tener al [employer] este medicamento? Efectos secundarios que debe informar a su mdico o a Barrister's clerk de la salud tan pronto como sea posible: -Chief of Staff como erupcin cutnea, picazn o urticarias, hinchazn de la cara, labios o lengua -secreciones o sensibilidad de las mamas -problemas respiratorios -cambios en la visin -depresin -sensacin de desmayos o mareos, cadas -fiebre -dolor en el abdomen, pecho, entrepierna o piernas -problemas de coordinacin, del habla, al caminar -cansancio o debilidad inusual -color amarillento de los ojos o la piel Efectos secundarios que, por lo general, no requieren Geophysical data processor (debe informarlos a mdico o a Barrister's clerk de la salud si persisten o si son molestos): -cne -retencin de lquidos e hinchazn -dolor de cabeza -perodos menstruales irregulares, manchando o ausencia de perodos  menstruales -dolor, picazn o reaccin cutnea temporal en el lugar de la inyeccin -aumento de peso Puede ser que esta lista no menciona todos los posibles efectos secundarios. Comunquese a su mdico por asesoramiento mdico [employer] Inc. Usted puede informar los efectos secundarios a la FDA por telfono al 1-800-FDA-1088. Dnde debo guardar mi medicina? No se aplica en este caso. Un profesional de Probation officer las inyecciones. ATENCIN: Este folleto es un resumen. Puede ser que no cubra toda la posible informacin. Si usted tiene preguntas acerca de esta medicina, consulte con su mdico, su farmacutico o su profesional de Technical sales engineer.  2015, Elsevier/Gold Standard. (2008-10-13 15:09:00) Quiste ovrico (Ovarian Cyst) Un quiste ovrico es una bolsa llena de lquido que se forma en el ovario. Los ovarios son los rganos pequeos que producen vulos en las mujeres. Se pueden formar varios tipos de [employer]. Benito Mccreedy no son cancerosos. Muchos de ellos no causan problemas y con frecuencia desaparecen solos. Algunos pueden provocar sntomas y requerir Clinical research associate. Los tipos ms comunes de quistes ovricos son los siguientes:  Quistes funcionales: estos quistes pueden aparecer todos los meses durante el ciclo menstrual. Esto es normal. Estos quistes suelen desaparecer con el prximo ciclo menstrual si la mujer no queda embarazada. En general, los quistes funcionales no tienen sntomas.  Endometriomas: estos quistes se forman a partir del tejido que recubre el tero. Tambin se denominan "quistes de chocolate" porque se llenan de sangre que se vuelve marrn. Este tipo de quiste puede Engineer, production en la zona inferior del abdomen durante la relacin sexual y con el perodo menstrual.  Cistoadenomas: este tipo se desarrolla a partir de las clulas que se [place] en el exterior del ovario. Estos quistes pueden ser muy grandes y causar dolor en la zona inferior del  abdomen y durante la relacin sexual. Cindra Presume tipo de quiste puede girar sobre s mismo, cortar el suministro de Biochemist, clinical y causar un dolor intenso. Tambin se  puede romper con facilidad y Stage manager.  Quistes dermoides: este tipo de quiste a veces se encuentra en ambos ovarios. Estos quistes pueden [employer]J's tipos de tejidos del organismo, como piel, dientes, pelo o Database administrator. Generalmente no tienen sntomas, a menos que sean muy grandes.  Quistes tecalutenicos: aparecen cuando se produce demasiada cantidad de cierta hormona (gonadotropina corinica humana) que estimula en exceso al ovario para que produzca vulos. Esto es ms frecuente despus de procedimientos que ayudan a la concepcin de un beb (fertilizacin in vitro). CAUSAS   Los medicamentos para la fertilidad pueden provocar una afeccin mediante la cual se forman mltiples quistes de gran tamao en los ovarios. Esta se denomina sndrome de hiperestimulacin ovrica.  El sndrome del ovario poliqustico es una afeccin que puede causar desequilibrios hormonales, los cuales pueden dar como resultado quistes ovricos no funcionales. SIGNOS Y SNTOMAS  Muchos quistes ovricos no causan sntomas. Si se presentan sntomas, stos pueden ser:  Dolor o molestias en la pelvis.  Dolor en la parte baja del abdomen.  [place].  Aumento del permetro abdominal (hinchazn).  Perodos menstruales anormales.  Aumento del [employer] perodos [place].  Cese de los perodos menstruales sin estar embarazada. DIAGNSTICO  Estos quistes se descubren comnmente durante un examen de rutina o una exploracin ginecolgica anual. Es posible que se ordenen otros estudios para obtener ms informacin sobre el [place]. Estos estudios pueden ser:  Engineer, materials.  Radiografas de la pelvis.  Tomografa computada.  Resonancia magntica.  Anlis[place] de [place]. TRATAMIENTO  Muchos de los quistes ovricos  desaparecen por s solos, sin tratamiento. Es probable que el mdico quiera controlar el quiste regularmente durante 2 o 67meses para ver si se produce algn cambio. En el caso de las mujeres en la menopausia, es particularmente importante controlar de cerca al quiste ya que el ndice de cncer de ovario en las mujeres menopusicas es ms alto. Cuando se requiere Clinical research associate, este puede incluir cualquiera de los siguientes:  Un procedimiento para drenar el quiste (aspiracin). Esto se puede realizar [employer] uso de [place] grande y [place]. Tambin se puede hacer a travs de un procedimiento laparoscpico, En este procedimiento, se inserta un tubo delgado que emite luz y que tiene una pequea cmara en un extremo (laparoscopio) a travs de una pequea incisin.  Ciruga para extirpar el quiste completo. Esto se puede realizar mediante una ciruga laparoscpica o Ardelia Mems ciruga abierta, la cual implica realizar una incisin ms grande en la parte inferior del abdomen.  Tratamiento hormonal o pldoras anticonceptivas. Estos mtodos a veces se usan para ayudar a Writer. [place] solo medicamentos de venta libre o recetados, segn las indicaciones del mdico.  Consulting civil engineer a las consultas de control con su mdico segn las indicaciones.  Hgase exmenes plvicos regulares y pruebas de [place]apan[place]ola[place]. SOLICITE ATENCIN MDICA SI:   Los perodos se atrasan, son irregulares, dolorosos o cesan.  El dolor plvico o abdominal no desaparece.  El abdomen se agranda o se hincha.  Siente presin en la vejiga o no puede vaciarla completamente.  Siente dolor durante las [employer].  Tiene una sensacin de hinchazn, presin o Manufacturing systems engineer.  Pierde peso sin razn aparente.  Siente un Pharmacist, hospital.  Est estreida.  Pierde el apetito.  Le aparece acn.  Nota un aumento del vello corporal y facial.  Elenore Rota de  peso sin hacer modificaciones en su actividad fsica  y en su dieta habitual.  Sospecha que est embarazada. SOLICITE ATENCIN MDICA DE INMEDIATO SI:   Siente cada vez ms dolor abdominal.  Tiene malestar estomacal (nuseas) y vomita.  Tiene fiebre que se presenta de [place] repentina.  Siente dolor abdominal al defecar.  Sus perodos menstruales son ms abundantes que lo habitual. ASEGRESE DE QUE:   Comprende estas instrucciones.  Controlar su afeccin.  Recibir ayuda de inmediato si no mejora o si empeora. Document Released: 05/11/2005 Document Revised: 08/06/2013 [facility] Patient Information 2015 [place]. This information is not intended to replace advice given to you by your health care provider. Make sure you discuss any questions you have with your health care provider.

## 2014-06-14 LAB — CBC WITH DIFFERENTIAL/PLATELET
BASOS ABS: 0.1 10*3/uL (ref 0.0–0.1)
BASOS PCT: 1 % (ref 0–1)
EOS PCT: 2 % (ref 0–5)
Eosinophils Absolute: 0.1 10*3/uL (ref 0.0–0.7)
HCT: 36.6 % (ref 36.0–46.0)
Hemoglobin: 11.1 g/dL — ABNORMAL LOW (ref 12.0–15.0)
LYMPHS PCT: 29 % (ref 12–46)
Lymphs Abs: 1.6 10*3/uL (ref 0.7–4.0)
MCH: 24.5 pg — ABNORMAL LOW (ref 26.0–34.0)
MCHC: 30.3 g/dL (ref 30.0–36.0)
MCV: 80.8 fL (ref 78.0–100.0)
MONO ABS: 0.4 10*3/uL (ref 0.1–1.0)
Monocytes Relative: 8 % (ref 3–12)
Neutro Abs: 3.4 10*3/uL (ref 1.7–7.7)
Neutrophils Relative %: 60 % (ref 43–77)
Platelets: 217 10*3/uL (ref 150–400)
RBC: 4.53 MIL/uL (ref 3.87–5.11)
RDW: 17.6 % — AB (ref 11.5–15.5)
WBC: 5.6 10*3/uL (ref 4.0–10.5)

## 2014-06-16 ENCOUNTER — Encounter: Payer: Self-pay | Admitting: Gynecology

## 2014-06-25 ENCOUNTER — Other Ambulatory Visit: Payer: Commercial Managed Care - PPO

## 2014-06-25 ENCOUNTER — Ambulatory Visit: Payer: Commercial Managed Care - PPO | Admitting: Gynecology

## 2014-08-25 ENCOUNTER — Ambulatory Visit: Payer: Commercial Managed Care - PPO | Admitting: Gynecology

## 2014-08-25 ENCOUNTER — Other Ambulatory Visit: Payer: Commercial Managed Care - PPO

## 2014-09-12 ENCOUNTER — Ambulatory Visit: Payer: Commercial Managed Care - PPO | Admitting: Gynecology

## 2014-09-12 ENCOUNTER — Other Ambulatory Visit: Payer: Commercial Managed Care - PPO

## 2014-09-18 ENCOUNTER — Ambulatory Visit (INDEPENDENT_AMBULATORY_CARE_PROVIDER_SITE_OTHER): Payer: Commercial Managed Care - PPO

## 2014-09-18 ENCOUNTER — Ambulatory Visit (INDEPENDENT_AMBULATORY_CARE_PROVIDER_SITE_OTHER): Payer: Commercial Managed Care - PPO | Admitting: Gynecology

## 2014-09-18 ENCOUNTER — Encounter: Payer: Self-pay | Admitting: Gynecology

## 2014-09-18 ENCOUNTER — Other Ambulatory Visit: Payer: Self-pay | Admitting: Gynecology

## 2014-09-18 VITALS — BP 120/76

## 2014-09-18 DIAGNOSIS — A499 Bacterial infection, unspecified: Secondary | ICD-10-CM

## 2014-09-18 DIAGNOSIS — N8320 Unspecified ovarian cysts: Secondary | ICD-10-CM

## 2014-09-18 DIAGNOSIS — N83201 Unspecified ovarian cyst, right side: Secondary | ICD-10-CM

## 2014-09-18 DIAGNOSIS — N898 Other specified noninflammatory disorders of vagina: Secondary | ICD-10-CM

## 2014-09-18 DIAGNOSIS — B9689 Other specified bacterial agents as the cause of diseases classified elsewhere: Secondary | ICD-10-CM

## 2014-09-18 DIAGNOSIS — N760 Acute vaginitis: Secondary | ICD-10-CM

## 2014-09-18 DIAGNOSIS — D251 Intramural leiomyoma of uterus: Secondary | ICD-10-CM

## 2014-09-18 DIAGNOSIS — R1900 Intra-abdominal and pelvic swelling, mass and lump, unspecified site: Secondary | ICD-10-CM

## 2014-09-18 LAB — WET PREP FOR TRICH, YEAST, CLUE
TRICH WET PREP: NONE SEEN
WBC WET PREP: NONE SEEN
Yeast Wet Prep HPF POC: NONE SEEN

## 2014-09-18 MED ORDER — TINIDAZOLE 500 MG PO TABS: ORAL_TABLET | ORAL | Status: DC

## 2014-09-18 MED ORDER — MEDROXYPROGESTERONE ACETATE 150 MG/ML IM SUSP
150.0000 mg | Freq: Once | INTRAMUSCULAR | Status: AC
  Administered 2014-09-18: 150 mg via INTRAMUSCULAR

## 2014-09-18 NOTE — Progress Notes (Signed)
   Patient presented to the office today for her follow-up on a right ovarian cyst. On 04/23/2014 patient had been seen for her annual gynecological examination and due to an incidental finding of fullness in her right adnexa and ultrasound had been ordered which demonstrated the following:  Ultrasound: Uterus measures 9.3 x 5.8 x 4.3 cm with endometrial stripe of 10.2 mm. An intramural myoma measuring 19 x 21 mm was described as displacing the endometrial cavity posteriorly. Patient had a right adnexal cystic mass with dimensions of 6.5 x 4.5 x 7.3 cm average size 6.1 cm echo-free area measuring 4.8 x 5.1 mm thickened area measuring 3.5 x 3.1 reticular echo pattern with positive color flow noted at the periphery. Arterial blood flow to the other was noted minimal fluid in the cul-de-sac. Left ovary was reported to be normal.   A CA-125 was ordered which was normal with a value of 18. She received a shot of Depo-Provera 150 mg and was to return back to the office in 3 months. She returned a month later in the ultrasound demonstrated the following:  Thickened endometrium. Right ovarian rim of tissue thinwall primarily echo-free cyst measuring 5.3 x 5.0 x 5.3 cm average size 5.2 cm was 6.1 cm 2 months ago. Since his avascular, tiny echogenic debris floating in the cyst. Left ovarian follicle measured 3.0 x 2.0 cm negative fluid in cul-de-sac.  She was offered laparoscopic cystectomy but due to the fact that she did not have any family to help her wanted to wait until the summer and she was asymptomatic. That Depo-Provera injection has worn off she's had normal cycles is asymptomatic today and is here for the ultrasound which demonstrated the following:  Uterus measured 9.8 x 6.0 x 5.0 cm with endometrial stripe was 8.6 mm. 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 A right thickened ovarian cyst echo-free measuring 55 x 50 x 49 mm average size 51 mm scattered tiny echogenic floating  debris. Since his avascular. Arterial blood flow the right ovary was noted. Left ovary was normal and no fluid in the cul-de-sac.  Patient was complaining of a vaginal discharge so wet prep was done and demonstrated clinical evidence of bacterial vaginosis (positive amine, many clue cells, too numerous to count bacteria)  Assessment/plan: #1 bacterial vaginosis will be treated with Tindamax 500 mg 4 tablets today and repeat in 24 hours #2 persistence of a simple 5 cm right ovarian cyst present patient would like to wait until summer to have the cyst removed. We will recheck her CA 125 today as well as patient to receive a shot of Depo-Provera 150 mg IM today. She will return back to the office in 3 months for follow-up ultrasound.

## 2014-09-18 NOTE — Patient Instructions (Addendum)
Tinidazole tablets Qu es este medicamento? El TINIDAZOL es un medicamento antiinfeccioso. Se utiliza para tratar la amebiasis, giardiasis, tricomonosis y vaginosis. No es efectivo para resfros, gripe u otras infecciones de origen viral. Este medicamento puede ser utilizado para otros usos; si tiene alguna pregunta consulte con su proveedor de atencin mdica o con su farmacutico. MARCAS COMERCIALES DISPONIBLES: [place] Qu le debo informar a mi profesional de la salud antes de tomar este medicamento? Necesita saber si usted presenta alguno de los siguientes problemas o situaciones: -anemia u otros trastornos sanguneos -si consume bebidas alcohlicas con frecuencia -recibe hemodilisis -trastorno de convulsiones -una reaccin alrgica o inusual al tinidazol, a otros medicamentos, alimentos, colorantes o conservantes -si est embarazada o buscando quedar embarazada -si est amamantando a un beb Cmo debo utilizar este medicamento? Tome este medicamento por va oral con un vaso lleno de agua. Siga las instrucciones de la etiqueta del [place]. Tomar con alimentos. Tome sus dosis a intervalos regulares. No tome su medicamento con una frecuencia mayor a la indicada. Complete todas las dosis de su medicamento como se le haya indicado aun si se siente mejor. No omita ninguna dosis o suspenda el uso de su medicamento antes de lo indicado. Hable con su pediatra para informarse acerca del uso de este medicamento en nios. Aunque este medicamento ha sido recetado a nios tan menores como de 3 aos de edad para condiciones selectivas, las precauciones se aplican. Sobredosis: Pngase en contacto inmediatamente con un centro toxicolgico o una sala de urgencia si usted cree que haya tomado demasiado medicamento. ATENCIN: [employer] es solo para usted. No comparta este medicamento con nadie. Qu sucede si me olvido de una dosis? Si olvida una dosis, tmela lo antes posible. Si es casi la  hora de la prxima dosis, tome slo esa dosis. No tome dosis adicionales o dobles. Qu puede interactuar con este medicamento? No tome esta medicina con ninguno de los siguientes medicamentos: -alcohol o cualquier producto que contenga alcohol -solucin oral de amprenavir -disulfiram -inyeccin de paclitaxel -solucin oral de ritonavir -solucin oral de sertralina -inyeccin de sulfametoxasol-trimetoprima Esta medicina tambin puede interactuar con los siguientes medicamentos: -colestiramina -cimetidina -conivaptn -ciclosporina -fluorouracilo -fosfenitona, fenitona -quetoconazol -litio -fenobarbital -tacrolimo -warfarina Puede ser que esta lista no menciona todas las posibles interacciones. Informe a su profesional de KB Home	[place] de [employer] productos a base de hierbas, medicamentos de [place] o suplementos nutritivos que est tomando. Si usted fuma, consume bebidas alcohlicas o si utiliza drogas ilegales, indqueselo tambin a su profesional de KB Home	[place]. Algunas sustancias pueden interactuar con su medicamento. A qu debo estar atento al usar [employer]a? Si los sntomas no mejoran o si empeoran, consulte con su mdico o con su profesional de KB Home	[place]. Evite consumir las bebidas alcohlicas mientras toma este medicamento y durante tres das despus. El alcohol puede hacerle sentir mareado, enfermo o enrojecimiento. Si est recibiendo tratamiento para [place] enfermedad de transmisin sexual, evite todo contacto sexual hasta que haya terminado el [place]. Es posible que su pareja tambin necesite [place]. Qu efectos secundarios puedo tener al [employer] este medicamento? Efectos secundarios que debe informar a su mdico o a Barrister's clerk de la salud tan pronto como sea posible: -Chief of Staff como erupcin cutnea, picazn o urticarias, hinchazn de la cara, labios o lengua -problemas respiratorios -confusin, depresin -manchas oscuras o blancas en la  boca -sensacin de desmayos o mareos, cadas -fiebre, infeccin -entumecimiento, hormigueo, dolor o debilidad en las manos o pies -dolor al orinar -convulsiones -cansancio  o debilidad inusual -irritacin o flujo vaginal -vmito Efectos secundarios que, por lo general, no requieren atencin mdica (debe informarlos a su mdico o a su profesional de la salud si persisten o si son molestos): -orina de color marrn oscuro o rojo -diarrea -dolor de cabeza -prdida del apetito -sabor metlico -nuseas -Higher education careers adviser Puede ser que esta lista no menciona todos los posibles efectos secundarios. Comunquese a su mdico por asesoramiento mdico [employer]. Usted puede informar los efectos secundarios a la FDA por telfono al 1-800-FDA-1088. Dnde debo guardar mi medicina? Mantngala fuera del alcance de los nios. Gurdela a [employer], entre 15 y 81 grados C (82 y 21 grados F). Protjala de la luz y de la humedad. Mantenga el envase bien cerrado. Deseche todo el medicamento que no haya utilizado, despus de la fecha de vencimiento. ATENCIN: Este folleto es un resumen. Puede ser que no cubra toda la posible informacin. Si usted tiene preguntas acerca de esta medicina, consulte con su mdico, su farmacutico o su profesional de Technical sales engineer.  2015, Elsevier/Gold Standard. (2007-03-11 16:39:00) Histeroscopa (Hysteroscopy) La histeroscopa es un procedimiento que se [place] para observar el interior de la Marketing executive (tero) Puede indicarse por diferentes motivos, entre ellos:  Para evaluar una hemorragia anormal, un fibroma (tumor benigno, no canceroso), tumores, plipos o tejido cicatrizal (adherencias) y la posibilidad de cncer de tero.  Para detectar bultos (tumores) y otros crecimientos anormales uterinos.  Para buscar las causas por las que una mujer no queda embarazada (infertilidad) causas recurrentes de prdida de embarazo (abortos espontneos) o por la  prdida de un dispositivo intrauterino (DIU).  Para realizar un procedimiento de esterilizacin cerrando las trompas de Falopio desde adentro del tero. En este procedimiento, se coloca un tubo delgado y luminoso, con una cmara en el extremo (histeroscopio)para observar el interior del tero. La histeroscopa se realiza inmediatamente despus del perodo menstrual para asegurarse de que no existe embarazo. INFORME A SU MDICO:   Cualquier alergia que tenga.  Todos los [employer] [place], incluyendo vitaminas, hierbas, gotas oftlmicas, cremas y medicamentos de venta libre.  Problemas previos que usted o los [employer] de su familia hayan tenido con el uso de anestsicos.  Enfermedades de [employer].  Cirugas previas.  Padecimientos mdicos. RIESGOS Y COMPLICACIONES  Generalmente es un procedimiento seguro. Sin embargo, Games developer procedimiento, pueden surgir complicaciones. Las complicaciones posibles son:  Perforacin del tero.  Sangrado excesivo.  Infeccin.  Lesin en el cuello del tero.  Lesiones en otros rganos.  Reaccin alrgica a un medicamento.  Inoculacin de lquido en exceso dentro del tero. ANTES DEL PROCEDIMIENTO   Consulte a su mdico si debe cambiar o suspender los medicamentos que toma habitualmente.  No tome aspirina ni anticoagulantes durante la semana previa al procedimiento, o segn le hayan indicado. Pueden ocasionar hemorragias.  Si fuma, no lo haga durante las [employer] al procedimiento.  En algunos casos, el da anterior al procedimiento se coloca un medicamento en el cuello del tero. Este medicamento dilata el cuello y [place] la abertura. Esto facilita la insercin del instrumento en el tero durante el procedimiento.  No debe comer ni beber nada durante al menos 8 horas antes de la [place].  Pdale a alguna persona que la lleve a su casa luego del procedimiento. PROCEDIMIENTO   Le administrarn un medicamento  para relajarse (sedante). Tambin podrn administrarle uno de los siguientes medicamentos:  Medicamentos que adormecen el rea del cuello uterino (anestesia local).  Un medicamento  para que duerma durante el procedimiento (anestesia genera).  El histeroscopio se inserta a travs de la vagina, dentro del tero. La cmara del laparoscopio enva una imagen a una pantalla de televisin que se encuentra en el quirfano. [employer] modo, el mdico tendr Ardelia Mems buena visin del interior del tero.  Durante el Wellsite geologist un lquido o aire en el interior de [place], lo que le permitir al cirujano observar mejor.  En algunas ocasiones, el tejido del interior del tero se legra suavemente. Esas muestras de tejido se envan al laboratorio para ser examinadas. DESPUS DEL PROCEDIMIENTO   Si le han administrado anestesia general podr sentirse atontada durante algunas horas despus del procedimiento. Si se utiliza Tour manager, podr volver a su casa tan pronto como est estable y se sienta en condiciones.Vaginosis bacteriana (Bacterial Vaginosis) La vaginosis bacteriana es una infeccin vaginal que perturba el equilibrio normal de las bacterias que se encuentran en la vagina. Es el resultado de un crecimiento excesivo de ciertas bacterias. Esta es la infeccin vaginal ms frecuente en mujeres en edad reproductiva. El tratamiento es importante para prevenir complicaciones, especialmente en mujeres embarazadas, dado que puede causar un parto prematuro. CAUSAS  La vaginosis bacteriana se origina por un aumento de bacterias nocivas que, generalmente, estn presentes en cantidades ms pequeas en la vagina. Varios tipos diferentes de bacterias pueden causar esta afeccin. Sin embargo, la causa de su desarrollo no se comprende totalmente. [place] o comportamientos pueden exponerlo a un mayor riesgo de desarrollar vaginosis bacteriana, entre los que se incluyen: Tener  una nueva pareja sexual o mltiples parejas sexuales. Las duchas vaginales El uso del DIU (dispositivo intrauterino) como mtodo anticonceptivo. El contagio no se produce en baos, por ropas de cama, en piscinas o por contacto con objetos. SIGNOS Y SNTOMAS  Algunas mujeres que padecen vaginosis bacteriana no presentan signos ni sntomas. Los sntomas ms comunes son: Secrecin vaginal de color grisceo. Secrecin vaginal con olor similar al [employer], especialmente despus de Retail banker. Picazn o sensacin de ardor en la vagina o la vulva. Ardor o dolor al [employer]. DIAGNSTICO  Su mdico analizar su historia clnica y le examinar la vagina para detectar signos de vaginosis bacteriana. Puede tomarle Truddie Coco de flujo vaginal. Su mdico examinar esta muestra con un microscopio para controlar las bacterias y clulas anormales. Tambin puede realizarse un anlisis del pH vaginal.  TRATAMIENTO  La vaginosis bacteriana puede tratarse con antibiticos, en forma de comprimidos o de crema vaginal. Puede indicarse una segunda tanda de antibiticos si la afeccin se repite despus del tratamiento.  [place] solo medicamentos de venta libre o recetados, segn las indicaciones del mdico. Si le han recetado antibiticos, tmelos como se le indic. Asegrese de que finaliza la prescripcin completa aunque se sienta mejor. No mantenga relaciones sexuales Animator. Comunique a sus compaeros sexuales que sufre una infeccin vaginal. Deben consultar a su mdico y recibir tratamiento si tienen problemas, como picazn o una erupcin cutnea leve. Practique el sexo seguro usando preservativos y tenga un nico compaero sexual. SOLICITE ATENCIN MDICA SI:  Sus sntomas no mejoran despus de 3 das de [place]. Aumenta la secrecin o Conservation officer, historic buildings. Tiene fiebre. ASEGRESE DE QUE:  Comprende estas instrucciones. Controlar su  afeccin. Recibir ayuda de inmediato si no mejora o si empeora. PARA OBTENER MS INFORMACIN  Centros para el control y la prevencin de enfermedades Office manager for Disease Control and  Prevention, CDC): AppraiserFraud.fi Asociacin Estadounidense de la Salud Sexual (American Sexual Health Association, SHA): www.ashastd.org  Document Released: 11/08/2007 Document Revised: 05/22/2013 [facility] Patient Information 2015 [place], [place]. This information is not intended to replace advice given to you by your health care provider. Make sure you discuss any questions you have with your health care provider.    Podr sentir clicos leves. Es normal que esto dure un par [employer].  Podr tener una hemorragia, la que puede variar entre una pequea mancha durante algunos das hasta una hemorragia similar a Mining engineer durante 3-7 das. Esto es normal.  Si el resultado de los estudios no estn listos durante la visita, tenga otra entrevista con su mdico para conocerlos. Document Released: 08/01/2005 Document Revised: 05/22/2013 [facility] Patient Information 2015 [place], [place]. This information is not intended to replace advice given to you by your health care provider. Make sure you discuss any questions you have with your health care provider.  Quiste ovrico (Ovarian Cyst) Un quiste ovrico es una bolsa llena de lquido que se forma en el ovario. Los ovarios son los rganos pequeos que producen vulos en las mujeres. Se pueden formar varios tipos de [employer]. Benito Mccreedy no son cancerosos. Muchos de ellos no causan problemas y con frecuencia desaparecen solos. Algunos pueden provocar sntomas y requerir Clinical research associate. Los tipos ms comunes de quistes ovricos son los siguientes:  Quistes funcionales: estos quistes pueden aparecer todos los meses durante el ciclo menstrual. Esto es normal. Estos quistes suelen desaparecer con el prximo ciclo menstrual si la mujer no queda embarazada. En  general, los quistes funcionales no tienen sntomas.  Endometriomas: estos quistes se forman a partir del tejido que recubre el tero. Tambin se denominan "quistes de chocolate" porque se llenan de sangre que se vuelve marrn. Este tipo de quiste puede Engineer, production en la zona inferior del abdomen durante la relacin sexual y con el perodo menstrual.  Cistoadenomas: este tipo se desarrolla a partir de las clulas que se [place] en el exterior del ovario. Estos quistes pueden ser muy grandes y causar dolor en la zona inferior del abdomen y durante la relacin sexual. Cindra Presume tipo de quiste puede girar sobre s mismo, cortar el suministro de Biochemist, clinical y causar un dolor intenso. Tambin se puede romper con facilidad y Stage manager.  Quistes dermoides: este tipo de quiste a veces se encuentra en ambos ovarios. Estos quistes pueden BJ's tipos de tejidos del organismo, como piel, dientes, pelo o Database administrator. Generalmente no tienen sntomas, a menos que sean muy grandes.  Quistes tecalutenicos: aparecen cuando se produce demasiada cantidad de cierta hormona (gonadotropina corinica humana) que estimula en exceso al ovario para que produzca vulos. Esto es ms frecuente despus de procedimientos que ayudan a la concepcin de un beb (fertilizacin in vitro). CAUSAS   Los medicamentos para la fertilidad pueden provocar una afeccin mediante la cual se forman mltiples quistes de gran tamao en los ovarios. Esta se denomina sndrome de hiperestimulacin ovrica.  El sndrome del ovario poliqustico es una afeccin que puede causar desequilibrios hormonales, los cuales pueden dar como resultado quistes ovricos no funcionales. SIGNOS Y SNTOMAS  Muchos quistes ovricos no causan sntomas. Si se presentan sntomas, stos pueden ser:  Dolor o molestias en la pelvis.  Dolor en la parte baja del abdomen.  [place].  Aumento del permetro abdominal  (hinchazn).  Perodos menstruales anormales.  Aumento del Rock[employer]ation perodos [place].  Cese de los perodos  menstruales sin estar embarazada. DIAGNSTICO  Estos quistes se descubren comnmente durante un examen de rutina o una exploracin ginecolgica anual. Es posible que se ordenen otros estudios para obtener ms informacin sobre el [place]. Estos estudios pueden ser:  Engineer, materials.  Radiografas de la pelvis.  Tomografa computada.  Resonancia magntica.  Anlisis de [place]. TRATAMIENTO  Muchos de los quistes ovricos desaparecen por s solos, sin tratamiento. Es probable que el mdico quiera controlar el quiste regularmente durante 2 o 27mses para ver si se produce algn cambio. En el caso de las mujeres en la menopausia, es particularmente importante controlar de cerca al quiste ya que el ndice de cncer de ovario en las mujeres menopusicas es ms alto. Cuando se requiere tClinical research associate este puede incluir cualquiera de los siguientes:  Un procedimiento para drenar el quiste (aspiracin). Esto se puede realizar mFamily Dollar Storesuso de uGuamgrande y u[place] Tambin se puede hacer a travs de un procedimiento laparoscpico, En este procedimiento, se inserta un tubo delgado que emite luz y que tiene una pequea cmara en un extremo (laparoscopio) a travs de una pequea incisin.  Ciruga para extirpar el quiste completo. Esto se puede realizar mediante una ciruga laparoscpica o uArdelia Memsciruga abierta, la cual implica realizar una incisin ms grande en la parte inferior del abdomen.  Tratamiento hormonal o pldoras anticonceptivas. Estos mtodos a veces se usan para ayudar a dWriter IWashingtonsolo medicamentos de venta libre o recetados, segn las indicaciones del mdico.  CConsulting civil engineera las consultas de control con su mdico segn las indicaciones.  Hgase exmenes plvicos regulares y pruebas de [place]apan[place]. SOLICITE  ATENCIN MDICA SI:   Los perodos se atrasan, son irregulares, dolorosos o cesan.  El dolor plvico o abdominal no desaparece.  El abdomen se agranda o se hincha.  Siente presin en la vejiga o no puede vaciarla completamente.  Siente dolor durante las rOffice Depot  Tiene una sensacin de hinchazn, presin o mManufacturing systems engineer  Pierde peso sin razn aparente.  Siente un mPharmacist, hospital  Est estreida.  Pierde el apetito.  Le aparece acn.  Nota un aumento del vello corporal y facial.  AElenore Rotade peso sin hacer modificaciones en su actividad fsica y en su dieta habitual.  Sospecha que est embarazada. SOLICITE ATENCIN MDICA DE INMEDIATO SI:   Siente cada vez ms dolor abdominal.  Tiene malestar estomacal (nuseas) y vomita.  Tiene fiebre que se presenta de mKaltagrepentina.  Siente dolor abdominal al defecar.  Sus perodos menstruales son ms abundantes que lo habitual. ASEGRESE DE QUE:   Comprende estas instrucciones.  Controlar su afeccin.  Recibir ayuda de inmediato si no mejora o si empeora. Document Released: 05/11/2005 Document Revised: 08/06/2013 [facility] Information 2015 [place] This information is not intended to replace advice given to you by your health care provider. Make sure you discuss any questions you have with your health care provider.

## 2014-09-19 LAB — CA 125: CA 125: 16 U/mL (ref <35)

## 2014-12-17 ENCOUNTER — Ambulatory Visit: Payer: Commercial Managed Care - PPO | Admitting: Gynecology

## 2014-12-17 ENCOUNTER — Other Ambulatory Visit: Payer: Commercial Managed Care - PPO

## 2014-12-31 ENCOUNTER — Ambulatory Visit (INDEPENDENT_AMBULATORY_CARE_PROVIDER_SITE_OTHER): Payer: Commercial Managed Care - PPO | Admitting: Gynecology

## 2014-12-31 ENCOUNTER — Ambulatory Visit (INDEPENDENT_AMBULATORY_CARE_PROVIDER_SITE_OTHER): Payer: Commercial Managed Care - PPO

## 2014-12-31 ENCOUNTER — Other Ambulatory Visit: Payer: Self-pay | Admitting: Gynecology

## 2014-12-31 DIAGNOSIS — N83201 Unspecified ovarian cyst, right side: Secondary | ICD-10-CM

## 2014-12-31 DIAGNOSIS — N8320 Unspecified ovarian cysts: Secondary | ICD-10-CM | POA: Diagnosis not present

## 2014-12-31 DIAGNOSIS — N83202 Unspecified ovarian cyst, left side: Secondary | ICD-10-CM

## 2014-12-31 NOTE — Patient Instructions (Signed)
Laparoscopa diagnstica (Diagnostic Laparoscopy) La laparoscopa es un procedimiento quirrgico relativamente simple, de uso habitual y breve (menos de una hora) que se lleva a cabo para diagnosticar y tratar enfermedades del abdomen. El laparoscopio (tubo delgado, que emite luz, del tamao de un lpiz y similar a un telescopio) se inserta en el abdomen a travs de una pequea incisin (corte realizado por un cirujano). A travs de este instrumento, el profesional podr observar [employer] rganos del interior del abdomen (vientre) y ver si hay algo anormal. La laparoscopa podr llevarse a cabo tanto en el hospital como en un consultorio. Podrn administrarle un sedante suave que lo ayudar a relajarse antes y durante el procedimiento. Una vez en la sala de operaciones, le administrarn una anestesia general (a menos que usted y el profesional elijan otro tipo de anestesia). Despus de la laparoscopa, que generalmente dura menos de Leone Brand, ser eBay sala de recuperacin durante algunas horas. Cuando regrese a Medical illustrator, la Hotel manager [employer] y [place]. RIESGOS Y COMPLICACIONES Comparados con los beneficios, los riesgos de la laparoscopia son relativamente pocos. El Management consultant con usted los riesgos antes del procedimiento. Algunos problemas que pueden ocurrir luego de la intervencin son:  Infecciones.  Hemorragias.  Puede ocurrir que se lesionen otros rganos.  Efectos secundarios de Architect. PROCEDIMIENTO Una vez que se encuentra anestesiado, el cirujano insufla el abdomen con un gas inofensivo (dixido de carbono) para Museum/gallery exhibitions officer observacin de los rganos de la pelvis. El cirujano introduce el laparoscopio a travs de una pequea incisin en el ombligo o alrededor del mismo. Podr insertar otros instrumentos, como una sonda para mover los rganos o Optometrist algn procedimiento a travs de otra pequea incisin.  En ocasiones se  toma una biopsia (muestra de tejido) para un diagnstico ms preciso o para Conservator, museum/gallery. La biopsia consiste en tomar una pequea muestra de tejido durante la laparoscopia para enviarlo al patlogo (especialista en la observacin de clulas y muestras de tejido) y que lo examine en el microscopio para un diagnstico a nivel de los tejidos. DESPUES DEL PROCEDIMIENTO  Se libera el gas del abdomen.  Las incisiones se cierran con puntos (suturas). Debido a que las incisiones son pequeas (generalmente de menos de 1 cm) las molestias son mnimas luego del procedimiento. Es posible que sienta cierto Loss adjuster, chartered. Es consecuencia de Programmer, systems del tubo mientras se encontraba dormido. Es posible que sienta algn dolor abdominal no muy intenso. Tambin podr sentir [employer] en las incisiones realizadas para insertar los instrumentos en el abdomen.  El tiempo de recuperacin es reducido, siempre que no haya habido complicaciones.  Har reposo en la sala de recuperacin hasta que se encuentre estable y se sienta bien. Si no aparecen complicaciones, podr regresar a su casa. AVERIGE LOS RESULTADOS DE SU ANLISIS Durante su visita no contar con todos los [employer]. En este caso, tenga otra entrevista con su mdico para conocerlos. No piense que el resultado es normal si no tiene noticias de su mdico o de la institucin mdica. Es Building services engineer seguimiento de todos los [place] de [place]. INSTRUCCIONES [place] todos los medicamentos tal como se le indic.  Utilice los medicamentos de venta libre o de prescripcin para Conservation officer, historic buildings, Health and safety inspector o la [place], segn se lo indique el profesional que lo asiste.  Reanude las actividades habituales cuando se le indique.  Es preferible que se duche  y no tome baos de inmersin.  Reanude la actividad sexual luego de [place], o cuando lo autoricen.  No conduzca mientras se encuentre  bajo los efectos de narcticos. SOLICITE ATENCIN MDICA SI:  Siente un dolor abdominal inexplicable.  Siente dolor en los hombros, en la regin de los tirantes.  Si se siente aturdido o siente que se va a Insurance account manager.  Siente escalofros.  Usted o su nio tienen una temperatura oral de ms de 38,9 C (102 F).  Observa un drenaje purulento (similar al pus) que proviene de alguna de las heridas.  Usted o su hijo no puede realizar movimientos intestinales o evacuar gases.  Usted o su hijo sufren nuseas o vmitos. EST SEGURO QUE:   Comprende las instrucciones para el alta mdica.  Controlar su enfermedad.  Solicitar atencin mdica de inmediato segn las indicaciones. Document Released: 08/01/2005 Document Revised: 10/24/2011 [facility] Patient Information 2015 [place]. This information is not intended to replace advice given to you by your health care provider. Make sure you discuss any questions you have with your health care provider. Quiste ovrico (Ovarian Cyst) Un quiste ovrico es una bolsa llena de lquido que se forma en el ovario. Los ovarios son los rganos pequeos que producen vulos en las mujeres. Se pueden formar varios tipos de [employer] Strauss. Benito Mccreedy no son cancerosos. Muchos de ellos no causan problemas y con frecuencia desaparecen solos. Algunos pueden provocar sntomas y requerir Clinical research associate. Los tipos ms comunes de quistes ovricos son los siguientes:  Quistes funcionales: estos quistes pueden aparecer todos los meses durante el ciclo menstrual. Esto es normal. Estos quistes suelen desaparecer con el prximo ciclo menstrual si la mujer no queda embarazada. En general, los quistes funcionales no tienen sntomas.  Endometriomas: estos quistes se forman a partir del tejido que recubre el tero. Tambin se denominan "quistes de chocolate" porque se llenan de sangre que se vuelve marrn. Este tipo de quiste puede Engineer, production en la zona inferior  del abdomen durante la relacin sexual y con el perodo menstrual.  Cistoadenomas: este tipo se desarrolla a partir de las clulas que se [place] en el exterior del ovario. Estos quistes pueden ser muy grandes y causar dolor en la zona inferior del abdomen y durante la relacin sexual. Cindra Presume tipo de quiste puede girar sobre s mismo, cortar el suministro de Biochemist, clinical y causar un dolor intenso. Tambin se puede romper con facilidad y Stage manager.  Quistes dermoides: este tipo de quiste a veces se encuentra en ambos ovarios. Estos quistes pueden [employer]J's tipos de tejidos del organismo, como piel, dientes, pelo o Database administrator. Generalmente no tienen sntomas, a menos que sean muy grandes.  Quistes tecalutenicos: aparecen cuando se produce demasiada cantidad de cierta hormona (gonadotropina corinica humana) que estimula en exceso al ovario para que produzca vulos. Esto es ms frecuente despus de procedimientos que ayudan a la concepcin de un beb (fertilizacin in vitro). CAUSAS   Los medicamentos para la fertilidad pueden provocar una afeccin mediante la cual se forman mltiples quistes de gran tamao en los ovarios. Esta se denomina sndrome de hiperestimulacin ovrica.  El sndrome del ovario poliqustico es una afeccin que puede causar desequilibrios hormonales, los cuales pueden dar como resultado quistes ovricos no funcionales. SIGNOS Y SNTOMAS  Muchos quistes ovricos no causan sntomas. Si se presentan sntomas, stos pueden ser:  Dolor o molestias en la pelvis.  Dolor en la parte baja del abdomen.  Cadiz.  Aumento  del permetro abdominal (hinchazn).  Perodos menstruales anormales.  Aumento del [employer] perodos [place].  Cese de los perodos menstruales sin estar embarazada. DIAGNSTICO  Estos quistes se descubren comnmente durante un examen de rutina o una exploracin ginecolgica anual. Es posible que se ordenen  otros estudios para obtener ms informacin sobre el [place]. Estos estudios pueden ser:  Engineer, materials.  Radiografas de la pelvis.  Tomografa computada.  Resonancia magntica.  Anlisis de [place]. TRATAMIENTO  Muchos de los quistes ovricos desaparecen por s solos, sin tratamiento. Es probable que el mdico quiera controlar el quiste regularmente durante 2 o 3meses para ver si se produce algn cambio. En el caso de las mujeres en la menopausia, es particularmente importante controlar de cerca al quiste ya que el ndice de cncer de ovario en las mujeres menopusicas es ms alto. Cuando se requiere Clinical research associate, este puede incluir cualquiera de los siguientes:  Un procedimiento para drenar el quiste (aspiracin). Esto se puede realizar [employer] uso de [place] grande y [place]. Tambin se puede hacer a travs de un procedimiento laparoscpico, En este procedimiento, se inserta un tubo delgado que emite luz y que tiene una pequea cmara en un extremo (laparoscopio) a travs de una pequea incisin.  Ciruga para extirpar el quiste completo. Esto se puede realizar mediante una ciruga laparoscpica o Ardelia Mems ciruga abierta, la cual implica realizar una incisin ms grande en la parte inferior del abdomen.  Tratamiento hormonal o pldoras anticonceptivas. Estos mtodos a veces se usan para ayudar a Writer. [place] solo medicamentos de venta libre o recetados, segn las indicaciones del mdico.  Consulting civil engineer a las consultas de control con su mdico segn las indicaciones.  Hgase exmenes plvicos regulares y pruebas de [place]apan[place]ola[place]. SOLICITE ATENCIN MDICA SI:   Los perodos se atrasan, son irregulares, dolorosos o cesan.  El dolor plvico o abdominal no desaparece.  El abdomen se agranda o se hincha.  Siente presin en la vejiga o no puede vaciarla completamente.  Siente dolor durante las [employer].  Tiene una  sensacin de hinchazn, presin o Manufacturing systems engineer.  Pierde peso sin razn aparente.  Siente un Pharmacist, hospital.  Est estreida.  Pierde el apetito.  Le aparece acn.  Nota un aumento del vello corporal y facial.  Elenore Rota de peso sin hacer modificaciones en su actividad fsica y en su dieta habitual.  Sospecha que est embarazada. SOLICITE ATENCIN MDICA DE INMEDIATO SI:   Siente cada vez ms dolor abdominal.  Tiene malestar estomacal (nuseas) y vomita.  Tiene fiebre que se presenta de [place] repentina.  Siente dolor abdominal al defecar.  Sus perodos menstruales son ms abundantes que lo habitual. ASEGRESE DE QUE:   Comprende estas instrucciones.  Controlar su afeccin.  Recibir ayuda de inmediato si no mejora o si empeora. Document Released: 05/11/2005 Document Revised: 08/06/2013 [facility] Patient Information 2015 [place]. This information is not intended to replace advice given to you by your health care provider. Make sure you discuss any questions you have with your health care provider.

## 2014-12-31 NOTE — Progress Notes (Signed)
   Patient presented to the office today to discuss her ultrasound as part of her first continued follow-up of a persistent right ovarian cyst. She has been asymptomatic her history is as follows:  "On 04/23/2014 patient had been seen for her annual gynecological examination and due to an incidental finding of fullness in her right adnexa and ultrasound had been ordered which demonstrated the following:  Ultrasound: Uterus measures 9.3 x 5.8 x 4.3 cm with endometrial stripe of 10.2 mm. An intramural myoma measuring 19 x 21 mm was described as displacing the endometrial cavity posteriorly. Patient had a right adnexal cystic mass with dimensions of 6.5 x 4.5 x 7.3 cm average size 6.1 cm echo-free area measuring 4.8 x 5.1 mm thickened area measuring 3.5 x 3.1 reticular echo pattern with positive color flow noted at the periphery. Arterial blood flow to the other was noted minimal fluid in the cul-de-sac. Left ovary was reported to be normal.   A CA-125 was ordered which was normal with a value of 18. She received a shot of Depo-Provera 150 mg and was to return back to the office in 3 months. She returned a month later in the ultrasound demonstrated the following:  Thickened endometrium. Right ovarian rim of tissue thinwall primarily echo-free cyst measuring 5.3 x 5.0 x 5.3 cm average size 5.2 cm was 6.1 cm 2 months ago. Since his avascular, tiny echogenic debris floating in the cyst. Left ovarian follicle measured 3.0 x 2.0 cm negative fluid in cul-de-sac.  She was offered laparoscopic cystectomy but due to the fact that she did not have any family to help her wanted to wait until the summer and she was asymptomatic. That Depo-Provera injection has worn off she's had normal cycles is asymptomatic today and is here for the ultrasound which demonstrated the following:  Uterus measured 9.8 x 6.0 x 5.0 cm with endometrial stripe was 8.6 mm. 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 A right  thickened ovarian cyst echo-free measuring 55 x 50 x 49 mm average size 51 mm scattered tiny echogenic floating debris. Since his avascular. Arterial blood flow the right ovary was noted. Left ovary was normal and no fluid in the cul-de-sac."  She was administered a second shot of Depo-Provera 150 mg IM in February 4 and she's here to discuss the results to see if the cyst has regressed. Her CA 125 was 16 3 months ago.  Ultrasound today: Uterus measuring 9.6 x 5.1 x 5.1 cm within a meter stripe at 2.6 and meters. Right rim of right ovary tissue with thinwall echo-free cyst measuring 53 x 50 x 52 mm every size 5.2 cm avascular. Echogenic floating debris was noted with no change from previous scan. Left ovary thinwall echo-free avascular cyst measuring 35 x 25 x 30 mm every size 3 cm. No fluid in the cul-de-sac  Assessment/plan: Patient with persistent right ovarian cyst now with left ovarian cyst. Benign appearance. Normal CA 125 in the past we'll recheck today. Patient will be scheduled in June for laparoscopic bilateral ovarian cystectomy and bilateral salpingectomy. Literature information was provided. We'll see the patient one week prior to surgery for preop consultation and full examination.

## 2015-01-01 ENCOUNTER — Telehealth: Payer: Self-pay

## 2015-01-01 LAB — CA 125: CA 125: 16 U/mL (ref <35)

## 2015-01-01 NOTE — Telephone Encounter (Signed)
Rosemarie Ax called and spoke with patient as she is Spanish speaking.  Rosemarie Ax discussed ins benefits and patientis financial responsibility to [facility]. She informed patient that she is scheduled for June 30 at 1:00pm at [facility].  Patient told Rosemarie Ax that she is not sure she can have her surgery prepayment by that date. She will check on it and call next week and let us know. I will leave it scheduled as is for now.

## 2015-01-05 ENCOUNTER — Telehealth: Payer: Self-pay

## 2015-01-05 NOTE — Telephone Encounter (Signed)
Bethany Sheppard received a call from the patient that she is not in a position financially to schedule the Lap Bilat Cystectomy with Bilat Salpingectomy, that you recommended, at this time.  She said she need to wait until September.

## 2015-01-05 NOTE — Telephone Encounter (Signed)
That will be fine her cyst appear to be benign and CA 125 was normal. Will need to see her in 3 months (August) with ultrasound to see if cyst is still present then we can schedule for September

## 2015-01-13 ENCOUNTER — Other Ambulatory Visit: Payer: Self-pay | Admitting: Gynecology

## 2015-01-13 DIAGNOSIS — N83202 Unspecified ovarian cyst, left side: Principal | ICD-10-CM

## 2015-01-13 DIAGNOSIS — N83201 Unspecified ovarian cyst, right side: Secondary | ICD-10-CM

## 2015-01-14 ENCOUNTER — Other Ambulatory Visit: Payer: Self-pay | Admitting: Gynecology

## 2015-01-14 DIAGNOSIS — N83202 Unspecified ovarian cyst, left side: Principal | ICD-10-CM

## 2015-01-14 DIAGNOSIS — N83201 Unspecified ovarian cyst, right side: Secondary | ICD-10-CM

## 2015-01-14 NOTE — Telephone Encounter (Signed)
Bethany Sheppard spoke with patient and scheduled her for u/s for 03/25/15.

## 2015-02-12 ENCOUNTER — Ambulatory Visit (HOSPITAL_COMMUNITY)
Admission: RE | Admit: 2015-02-12 | Payer: Commercial Managed Care - PPO | Source: Ambulatory Visit | Admitting: Gynecology

## 2015-02-12 ENCOUNTER — Encounter (HOSPITAL_COMMUNITY): Admission: RE | Payer: Self-pay | Source: Ambulatory Visit

## 2015-02-12 SURGERY — SALPINGECTOMY, BILATERAL, LAPAROSCOPIC
Anesthesia: General | Laterality: Bilateral

## 2015-03-25 ENCOUNTER — Other Ambulatory Visit: Payer: Commercial Managed Care - PPO

## 2015-03-25 ENCOUNTER — Ambulatory Visit: Payer: Commercial Managed Care - PPO | Admitting: Gynecology

## 2016-12-28 ENCOUNTER — Encounter: Payer: Self-pay | Admitting: Gynecology

## 2020-09-21 ENCOUNTER — Ambulatory Visit (INDEPENDENT_AMBULATORY_CARE_PROVIDER_SITE_OTHER): Payer: Commercial Managed Care - PPO | Admitting: Physician Assistant

## 2020-09-21 ENCOUNTER — Ambulatory Visit (INDEPENDENT_AMBULATORY_CARE_PROVIDER_SITE_OTHER): Payer: Commercial Managed Care - PPO

## 2020-09-21 ENCOUNTER — Other Ambulatory Visit: Payer: Self-pay

## 2020-09-21 VITALS — BP 126/83 | HR 76 | Temp 97.3°F | Resp 18 | Ht 62.0 in | Wt 154.0 lb

## 2020-09-21 DIAGNOSIS — M25562 Pain in left knee: Secondary | ICD-10-CM | POA: Diagnosis not present

## 2020-09-21 NOTE — Progress Notes (Signed)
Patient takes only vitamins and has eaten today.patient denies pain at this time.

## 2020-09-21 NOTE — Patient Instructions (Addendum)
I would like you to use a brace, ice and OTC pain relievers    We will call you with the xray results  Kennieth Rad, PA-C Physician Assistant [place] [email or web address]     Dolor de rodilla crnico en los adultos Chronic Knee Pain, Adult El dolor de rodilla crnico es el dolor en una o en ambas rodillas que dura ms de 3 meses. Los sntomas de dolor de rodilla crnico pueden incluir hinchazn, rigidez y Tree surgeon. El desgaste de la articulacin de la rodilla (osteoartritis) relacionado con la edad es la causa ms frecuente de dolor de rodilla crnico. Otras causas posibles incluyen lo siguiente:  Una enfermedad a largo plazo relacionada con el sistema inmunitario que causa inflamacin de la rodilla (artritis reumatoide). Por lo general, afecta ambas rodillas.  Artritis inflamatoria, como gota o pseudogota.  Una lesin en la rodilla que causa artritis.  Una lesin en la rodilla que daa los ligamentos. Los ligamentos son tejidos fuertes que [employer] s.  Rodilla de corredor o dolor detrs de la rtula. El tratamiento para el dolor de rodilla crnico depende de su causa. Los tratamientos principales para el dolor de rodilla crnico son la fisioterapia y la prdida de [place]. Esta afeccin tambin puede tratarse con medicamentos, inyecciones, una rodillera o un dispositivo ortopdico, y el uso de [place]. Adems, puede recomendarse reposo, hielo, presin (compresin) y elevacin, lo que tambin se conoce como terapia RHCE. Siga estas instrucciones en su casa: Si tiene una rodillera o un dispositivo ortopdico:  Use la rodillera o el dispositivo ortopdico como se lo haya indicado el mdico. Quteselos solamente como se lo haya indicado el mdico.  Afljelos si siente hormigueo en los dedos del pie, se le adormecen o se le enfran y se tornan azulados.  Mantngalos limpios.  Si la rodillera o el dispositivo  ortopdico no son impermeables: ? No deje que se mojen. ? Quteselos, si el mdico se lo permite, o cbralos con un envoltorio hermtico cuando tome un bao de inmersin o una ducha.   Control del dolor, la rigidez y la hinchazn  Si se lo indican, aplique calor en la zona afectada con la frecuencia que le haya indicado el mdico. Use la fuente de calor que el mdico le recomiende, como una compresa de calor hmedo o una almohadilla trmica. ? Si [place] una rodillera o un dispositivo ortopdico desmontables, quteselos segn las indicaciones del mdico. ? Coloque una toalla entre la piel y la fuente de Freight forwarder. ? Aplique calor durante 20 a 73mnutos. ? Retire la fuente de calor si la piel se pone de color rojo brillante. Esto es especialmente importante si no puede sentir dolor, calor o fro. Puede correr un riesgo mayor de sufrir quemaduras.  Si se lo indican, aplique hielo sobre la zona afectada. Para hacer esto: ? Si uCanadauna rodillera o un dispositivo ortopdico desmontables, quteselos segn las indicaciones del mdico. ? Ponga el hielo en una bolsa plstica. ? Coloque una tGenuine Partspiel y lTherapist, nutritional ? Aplique el hielo durante 234mutos, 2 o 3veces por da. ? Retire el hielo si la piel se pone de color rojo brillante. Esto es muPepsiCoSi no puede sentir dolor, calor o fro, tiene un mayor riesgo de que se dae la zona.  Mueva los dedos del pie con frecuencia para reducir la rigidez y la hinchazn.  Cuando est sentado o acostado, levante (eleve) la zona lesionada por encima del nivel del corazn.  Actividad  Evite las actividades o los ejercicios de alto impacto, como correr, Art therapist soga o hacer saltos de tijera.  Siga el plan de ejercicios que el mdico dise para usted. El mdico puede recomendarle lo siguiente: ? Product/process development scientist las actividades que empeoren el dolor de rodilla. Esto puede exigirle que modifique sus rutinas de ejercicio, participacin en deportes u  obligaciones laborales. ? Usar calzado con suelas acolchonadas. ? Evitar deportes que requieran correr y [place] manager de direccin repentinamente. ? Realizar fisioterapia. La fisioterapia est planificada para satisfacer sus necesidades y capacidades. Puede incluir ejercicios para la fuerza, la flexibilidad, la estabilidad y la resistencia. ? Haga ejercicios que mejoren el equilibrio y la fuerza, [place] tai chi y el yoga.  No apoye el peso del cuerpo NIKE extremidad Altria Group que lo autorice el mdico. Use las [employer]VR Inc se lo haya indicado el mdico.  Retome sus actividades normales segn lo indicado por el mdico. Pregntele al mdico qu actividades son seguras para usted. Indicaciones generales  Use los medicamentos de venta libre y los recetados solamente como se lo haya indicado el mdico.  Baje de peso si es necesario. Perder incluso un poco de peso puede reducir el dolor de rodilla. Pregntele al mdico cul es su peso ideal y cmo Horticulturist, commercial sin riesgos. Un nutricionista puede ayudarlo a planificar sus comidas.  No consuma ningn producto que contenga nicotina o tabaco, como cigarrillos, cigarrillos electrnicos y tabaco de Higher education careers adviser. Estos pueden retrasar la recuperacin. Si necesita ayuda para dejar de consumir estos productos, consulte al [employer].  Cumpla con todas las visitas de seguimiento. Esto es importante. Comunquese con un mdico si:  Tiene un dolor de rodilla que no mejora o que [place].  No puede realizar los ejercicios de fisioterapia debido al dolor de rodilla. Solicite ayuda de inmediato si:  La rodilla se hincha y la hinchazn empeora.  No puede mover la rodilla.  Siente dolor intenso en la rodilla. Resumen  El dolor de rodilla que dura ms de 3 meses se considera dolor de rodilla crnico.  Los tratamientos principales para el dolor de rodilla crnico son la fisioterapia y la prdida de [place]. Tambin es posible que deba tomar medicamentos, usar una  rodillera o un dispositivo ortopdico, usar muletas y aplicarse hielo o calor en la rodilla.  Perder incluso un poco de peso puede reducir el dolor de rodilla. Pregntele al mdico cul es su peso ideal y cmo Horticulturist, commercial sin riesgos. Un nutricionista puede ayudarlo a planificar sus comidas.  Siga el plan de ejercicios que el mdico dise para usted. Esta informacin no tiene Marine scientist el consejo del mdico. Asegrese de hacerle al mdico cualquier pregunta que tenga. Document Revised: 03/03/2020 Document Reviewed: 03/03/2020 Elsevier Patient Education  [place].

## 2020-09-21 NOTE — Progress Notes (Signed)
New Patient Office Visit  Subjective:  Patient ID: Bethany Sheppard, female    DOB: 1971-02-03  Age: 50 y.o. MRN: [id number]  CC:  Chief Complaint  Patient presents with   Knee Pain    HPI Bethany Sheppard reports that she had a pain in her left  leg, started about a year ago and used ice with relief, and then a month ago it returned.  States that the pain is only present when she bends over.  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  Reports that she has been using ice with some relief.   Denies injury or trauma Due to language barrier, an interpreter was present during the history-taking and subsequent discussion (and for part of the physical exam) with this patient.    Past Medical History:  Diagnosis Date   H/O vitamin D deficiency     Past Surgical History:  Procedure Laterality Date   PELVIC LAPAROSCOPY     tubal ligation    Family History  Problem Relation Age of Onset   Hypertension Sister    Cancer Brother        stomach   Thyroid disease Maternal Uncle     Social History   Socioeconomic History   Marital status: Single    Spouse name: Not on file   Number of children: Not on file   Years of education: Not on file   Highest education level: Not on file  Occupational History   Not on file  Tobacco Use   Smoking status: Never Smoker   Smokeless tobacco: Never Used  Substance and Sexual Activity   Alcohol use: Yes    Comment: occ   Drug use: No   Sexual activity: Yes    Birth control/protection: Surgical    Comment: TUBAL LIGATION  Other Topics Concern   Not on file  Social History Narrative   Not on file   Social Determinants of Health   Financial Resource Strain: Not on file  Food Insecurity: Not on file  Transportation Needs: Not on file  Physical Activity: Not on file  Stress: Not on file  Social Connections: Not on file  Intimate Partner Violence: Not on file    ROS Review of Systems   Constitutional: Negative.   HENT: Negative.   Eyes: Negative.   Respiratory: Negative.   Cardiovascular: Negative.   Gastrointestinal: Negative.   Endocrine: Negative.   Genitourinary: Negative.   Musculoskeletal: Positive for arthralgias and myalgias.  Skin: Negative.   Allergic/Immunologic: Negative.   Neurological: Negative.   Hematological: Negative.   Psychiatric/Behavioral: Negative.     Objective:   Today's Vitals: BP 126/83 (BP Location: Right Arm, Patient Position: Sitting, Cuff Size: Normal)    Pulse 76    Temp (!) 97.3 F (36.3 C) (Oral)    Resp 18    Ht 5\' 2"  (1.575 m)    Wt 154 lb (69.9 kg)    LMP 11/20/2014    SpO2 100%    BMI 28.17 kg/m   Physical Exam Vitals and nursing note reviewed.  Constitutional:      Appearance: Normal appearance.  HENT:     Head: Normocephalic and atraumatic.     Right Ear: External ear normal.     Left Ear: External ear normal.     Nose: Nose normal.     Mouth/Throat:     Mouth: Mucous membranes are moist.     Pharynx: Oropharynx is clear.  Cardiovascular:  Rate and Rhythm: Normal rate and regular rhythm.     Pulses: Normal pulses.     Heart sounds: Normal heart sounds.  Pulmonary:     Effort: Pulmonary effort is normal.     Breath sounds: Normal breath sounds.  Musculoskeletal:        General: No swelling or tenderness. Normal range of motion.     Cervical back: Normal range of motion and neck supple.     Right knee: No swelling or crepitus. Normal range of motion. No tenderness.     Left knee: Normal. No swelling or crepitus. Normal range of motion. No tenderness.     Right lower leg: Normal.     Left lower leg: Normal.  Skin:    General: Skin is warm and dry.  Neurological:     General: No focal deficit present.     Mental Status: She is alert and oriented to person, place, and time.  Psychiatric:        Mood and Affect: Mood normal.        Behavior: Behavior normal.        Thought Content: Thought content  normal.        Judgment: Judgment normal.     Assessment & Plan:   Problem List Items Addressed This Visit   None   Visit Diagnoses    Acute pain of left knee    -  Primary   Relevant Orders   DG Knee 1-2 Views Left (Completed)      Outpatient Encounter Medications as of 09/21/2020  Medication Sig   calcium carbonate (OS-CAL) 600 MG TABS tablet Take 600 mg by mouth 2 (two) times daily with a meal.   ergocalciferol (VITAMIN D2) 50000 UNITS capsule Take 1 capsule (50,000 Units total) by mouth once a week.   tinidazole (TINDAMAX) 500 MG tablet Take four tablets today and four tablets tomorrow at the same time   No facility-administered encounter medications on file as of 09/21/2020.   1. Acute pain of left knee Patient education given on RICE,, over-the-counter pain relievers.  Red flags given for prompt reevaluation  - DG Knee 1-2 Views Left   I have reviewed the patient's medical history (PMH, PSH, Social History, Family History, Medications, and allergies) , and have been updated if relevant. I spent 30 minutes reviewing chart and  face to face time with patient.    Follow-up: Return if symptoms worsen or fail to improve.   Loraine Grip Mayers, PA-C

## 2020-09-22 ENCOUNTER — Telehealth: Payer: Self-pay | Admitting: *Deleted

## 2020-09-22 NOTE — Telephone Encounter (Signed)
-----   Message from Kennieth Rad, [place] sent at 09/22/2020  2:23 PM EST ----- Please let patient know that her x-ray was negative for any abnormalities.  If her pain continues, the next up would be for her to be seen by orthopedics.

## 2020-09-22 NOTE — Telephone Encounter (Signed)
Medical Assistant used [employer] to contact patient.  Interpreter Name: Leeann Must #: [id number] Patient verified DOB Patient is aware of xray being normal, if pain persist patient has the option of seeing ortho if the pain persist.

## 2020-09-23 ENCOUNTER — Encounter: Payer: Self-pay | Admitting: Physician Assistant

## 2020-09-23 DIAGNOSIS — M25562 Pain in left knee: Secondary | ICD-10-CM | POA: Insufficient documentation

## 2020-09-23 NOTE — Addendum Note (Signed)
Addended by: Kennieth Rad on: 09/23/2020 09:01 AM   Modules accepted: Orders

## 2020-10-02 ENCOUNTER — Ambulatory Visit: Payer: Commercial Managed Care - PPO | Admitting: Orthopaedic Surgery

## 2020-10-02 ENCOUNTER — Ambulatory Visit: Payer: Self-pay

## 2020-10-02 DIAGNOSIS — M25562 Pain in left knee: Secondary | ICD-10-CM

## 2020-10-02 NOTE — Progress Notes (Signed)
Office Visit Note   Patient: Bethany Sheppard           Date of Birth: 1971/04/24           MRN: [id number] Visit Date: 10/02/2020              Requested by: Mayers, Loraine Grip, PA-C [id number] [place],   [id number] PCP: No primary care provider on file.   Assessment & Plan: Visit Diagnoses:  1. Left knee pain, unspecified chronicity     Plan: Impression is left knee arthritis flareup.  We discussed treatment options to include cortisone injection for which she would like to proceed.  She will follow up with [place] as needed.  Follow-Up Instructions: Return if symptoms worsen or fail to improve.   Orders:  Orders Placed This Encounter  Procedures  . Large Joint Inj: L knee  . XR KNEE 3 VIEW LEFT   No orders of the defined types were placed in this encounter.     Procedures: Large Joint Inj: L knee on 10/02/2020 9:26 AM Indications: pain Details: 22 G needle, anterolateral approach Medications: 2 mL lidocaine 1 %; 2 mL bupivacaine 0.25 %; 40 mg methylPREDNISolone acetate 40 MG/ML      Clinical Data: No additional findings.   Subjective: Chief Complaint  Patient presents with  . Left Knee - Pain    HPI patient is a pleasant 50 year old Spanish-speaking female who is here today with an interpreter.  She has been having left knee pain for the past 2 months.  No known injury or change in activity.  The pain is primarily to the posterior aspect.  The pain she has primarily with squatting.  She denies any swelling or mechanical symptoms.  No instability.  She has been taking Tylenol with mild relief of symptoms.  No paresthesias.  No previous injection or surgical intervention.  Review of Systems as detailed in HPI.  All others reviewed and are negative.   Objective: Vital Signs: LMP 11/20/2014   Physical Exam well-developed well-nourished female no acute distress.  Alert and oriented x3.  Ortho Exam left knee exam shows no effusion.  Range  of motion 0 to 120 degrees.  No joint line tenderness.  Mild patellofemoral crepitus.  She does have slight fullness in the popliteal fossa but this is nontender.  Ligaments are stable.  Calf is soft and nontender.  She is neurovascularly intact distally.  Specialty Comments:  No specialty comments available.  Imaging: XR KNEE 3 VIEW LEFT  Result Date: 10/02/2020 Mild degenerative changes the medial patellofemoral compartments    PMFS History: Patient Active Problem List   Diagnosis Date Noted  . Acute pain of left knee 09/23/2020  . Bilateral ovarian cysts 12/31/2014  . Iron deficiency anemia 06/13/2014  . History of vitamin D deficiency 04/23/2014  . Thyroiditis 06/21/2011   Past Medical History:  Diagnosis Date  . H/O vitamin D deficiency     Family History  Problem Relation Age of Onset  . Hypertension Sister   . Cancer Brother        stomach  . Thyroid disease Maternal Uncle     Past Surgical History:  Procedure Laterality Date  . PELVIC LAPAROSCOPY     tubal ligation   Social History   Occupational History  . Not on file  Tobacco Use  . Smoking status: Never Smoker  . Smokeless tobacco: Never Used  Substance and Sexual Activity  . Alcohol use: Yes  Comment: occ  . Drug use: No  . Sexual activity: Yes    Birth control/protection: Surgical    Comment: TUBAL LIGATION

## 2020-10-04 MED ORDER — BUPIVACAINE HCL 0.25 % IJ SOLN
2.0000 mL | INTRAMUSCULAR | Status: AC | PRN
Start: 2020-10-02 — End: 2020-10-02
  Administered 2020-10-02: 2 mL via INTRA_ARTICULAR

## 2020-10-04 MED ORDER — LIDOCAINE HCL 1 % IJ SOLN
2.0000 mL | INTRAMUSCULAR | Status: AC | PRN
  Administered 2020-10-02: 2 mL

## 2020-10-04 MED ORDER — METHYLPREDNISOLONE ACETATE 40 MG/ML IJ SUSP
40.0000 mg | INTRAMUSCULAR | Status: AC | PRN
  Administered 2020-10-02: 40 mg via INTRA_ARTICULAR

## 2020-10-09 ENCOUNTER — Other Ambulatory Visit (HOSPITAL_COMMUNITY)
Admission: RE | Admit: 2020-10-09 | Discharge: 2020-10-09 | Disposition: A | Discharge status: Home / Self Care | Payer: Commercial Managed Care - PPO | Source: Ambulatory Visit | Attending: Family | Admitting: Family

## 2020-10-09 ENCOUNTER — Ambulatory Visit (INDEPENDENT_AMBULATORY_CARE_PROVIDER_SITE_OTHER): Payer: Commercial Managed Care - PPO | Admitting: Family

## 2020-10-09 ENCOUNTER — Other Ambulatory Visit: Payer: Self-pay

## 2020-10-09 ENCOUNTER — Encounter: Payer: Self-pay | Admitting: Family

## 2020-10-09 VITALS — BP 113/63 | HR 87 | Wt 156.0 lb

## 2020-10-09 DIAGNOSIS — D509 Iron deficiency anemia, unspecified: Secondary | ICD-10-CM | POA: Diagnosis not present

## 2020-10-09 DIAGNOSIS — R300 Dysuria: Secondary | ICD-10-CM | POA: Insufficient documentation

## 2020-10-09 DIAGNOSIS — Z789 Other specified health status: Secondary | ICD-10-CM

## 2020-10-09 DIAGNOSIS — Z13228 Encounter for screening for other metabolic disorders: Secondary | ICD-10-CM | POA: Diagnosis not present

## 2020-10-09 LAB — POCT URINALYSIS DIP (CLINITEK)
Bilirubin, UA: NEGATIVE
Blood, UA: NEGATIVE
Glucose, UA: NEGATIVE mg/dL
Ketones, POC UA: NEGATIVE mg/dL
Nitrite, UA: NEGATIVE
POC PROTEIN,UA: NEGATIVE
Spec Grav, UA: 1.025 (ref 1.010–1.025)
Urobilinogen, UA: 0.2 E.U./dL
pH, UA: 6.5 (ref 5.0–8.0)

## 2020-10-09 NOTE — Progress Notes (Signed)
Pain in abdomen Burning during urination  Ibruprofen helps pain

## 2020-10-09 NOTE — Progress Notes (Signed)
Patient ID: Bethany Sheppard, female    DOB: 1971/05/25  MRN: [id number]  CC: Burning with Urination  Subjective: Bethany Sheppard is a 50 y.o. female who presents for burning with urination.  1. BURNING WITH URINATION: Burning with urination began about 11 days ago. Also symptoms of vaginal itching and lower stomach pain. Symptoms interrupt sleep. Started drinking water and taking Motrin which helped.    Patient Active Problem List   Diagnosis Date Noted  . Acute pain of left knee 09/23/2020  . Bilateral ovarian cysts 12/31/2014  . Iron deficiency anemia 06/13/2014  . History of vitamin D deficiency 04/23/2014  . Thyroiditis 06/21/2011     Current Outpatient Medications on File Prior to Visit  Medication Sig Dispense Refill  . calcium carbonate (OS-CAL) 600 MG TABS tablet Take 600 mg by mouth 2 (two) times daily with a meal.    . ergocalciferol (VITAMIN D2) 50000 UNITS capsule Take 1 capsule (50,000 Units total) by mouth once a week. 12 capsule 0  . tinidazole (TINDAMAX) 500 MG tablet Take four tablets today and four tablets tomorrow at the same time 8 tablet 0   No current facility-administered medications on file prior to visit.    No Known Allergies  Social History   Socioeconomic History  . Marital status: Single    Spouse name: Not on file  . Number of children: Not on file  . Years of education: Not on file  . Highest education level: Not on file  Occupational History  . Not on file  Tobacco Use  . Smoking status: Never Smoker  . Smokeless tobacco: Never Used  Vaping Use  . Vaping Use: Never used  Substance and Sexual Activity  . Alcohol use: Yes    Comment: occ  . Drug use: No  . Sexual activity: Yes    Birth control/protection: Surgical    Comment: TUBAL LIGATION  Other Topics Concern  . Not on file  Social History Narrative  . Not on file   Social Determinants of Health   Financial Resource Strain: Not on file  Food  Insecurity: Not on file  Transportation Needs: Not on file  Physical Activity: Not on file  Stress: Not on file  Social Connections: Not on file  Intimate Partner Violence: Not on file    Family History  Problem Relation Age of Onset  . Hypertension Sister   . Cancer Brother        stomach  . Thyroid disease Maternal Uncle     Past Surgical History:  Procedure Laterality Date  . PELVIC LAPAROSCOPY     tubal ligation    ROS: Review of Systems Negative except as stated above  PHYSICAL EXAM: BP 113/63 (BP Location: Left Arm, Patient Position: Sitting)   Pulse 87   Wt 156 lb (70.8 kg)   LMP 11/20/2014   SpO2 99%   BMI 28.53 kg/m   Physical Exam HENT:     Head: Normocephalic and atraumatic.  Eyes:     Extraocular Movements: Extraocular movements intact.     Pupils: Pupils are equal, round, and reactive to light.  Cardiovascular:     Rate and Rhythm: Normal rate and regular rhythm.     Pulses: Normal pulses.     Heart sounds: Normal heart sounds.  Pulmonary:     Effort: Pulmonary effort is normal.     Breath sounds: Normal breath sounds.  Genitourinary:    Comments: Patient declined examination. Musculoskeletal:  Cervical back: Normal range of motion and neck supple.  Neurological:     General: No focal deficit present.     Mental Status: She is alert and oriented to person, place, and time.  Psychiatric:        Mood and Affect: Mood normal.        Behavior: Behavior normal.     Results for orders placed or performed in visit on 10/09/20  Comprehensive metabolic panel  Result Value Ref Range   Glucose 89 65 - 99 mg/dL   BUN 15 6 - 24 mg/dL   Creatinine, Ser 0.58 0.57 - 1.00 mg/dL   GFR calc non Af Amer 107 >59 mL/min/1.73   GFR calc Af Amer 123 >59 mL/min/1.73   BUN/Creatinine Ratio 26 (H) 9 - 23   Sodium 140 134 - 144 mmol/L   Potassium 3.9 3.5 - 5.2 mmol/L   Chloride 100 96 - 106 mmol/L   CO2 23 20 - 29 mmol/L   Calcium 9.7 8.7 - 10.2 mg/dL    Total Protein 7.4 6.0 - 8.5 g/dL   Albumin 4.5 3.8 - 4.9 g/dL   Globulin, Total 2.9 1.5 - 4.5 g/dL   Albumin/Globulin Ratio 1.6 1.2 - 2.2   Bilirubin Total <0.2 0.0 - 1.2 mg/dL   Alkaline Phosphatase 118 44 - 121 IU/L   AST 19 0 - 40 IU/L   ALT 14 0 - 32 IU/L  CBC  Result Value Ref Range   WBC 6.4 3.4 - 10.8 x10E3/uL   RBC 4.84 3.77 - 5.28 x10E6/uL   Hemoglobin 14.1 11.1 - 15.9 g/dL   Hematocrit 43.0 34.0 - 46.6 %   MCV 89 79 - 97 fL   MCH 29.1 26.6 - 33.0 pg   MCHC 32.8 31.5 - 35.7 g/dL   RDW 13.7 11.7 - 15.4 %   Platelets 211 150 - 450 x10E3/uL  POCT URINALYSIS DIP (CLINITEK)  Result Value Ref Range   Color, UA yellow yellow   Clarity, UA cloudy (A) clear   Glucose, UA negative negative mg/dL   Bilirubin, UA negative negative   Ketones, POC UA negative negative mg/dL   Spec Grav, UA 1.025 1.010 - 1.025   Blood, UA negative negative   pH, UA 6.5 5.0 - 8.0   POC PROTEIN,UA negative negative, trace   Urobilinogen, UA 0.2 0.2 or 1.0 E.U./dL   Nitrite, UA Negative Negative   Leukocytes, UA Trace (A) Negative    ASSESSMENT AND PLAN: 1. Burning with urination: - Urinalysis negative for urinary tract infection.  - Cervicovaginal self-swab to screen for chlamydia, gonorrhea, bacterial vaginosis, yeast infection, and trichomonas. Will call with results.  - POCT URINALYSIS DIP (CLINITEK) - Cervicovaginal ancillary only  2. Screening for metabolic disorder: - CMP to check kidney function, liver function, and electrolyte balance.  - Comprehensive metabolic panel  3. Iron deficiency anemia, unspecified iron deficiency anemia type: - CBC to screen for anemia. - CBC  4. Language barrier: - Declined interpretation services during today's visit.   Patient was given the opportunity to ask questions.  Patient verbalized understanding of the plan and was able to repeat key elements of the plan. Patient was given clear instructions to go to Emergency Department or return to medical  center if symptoms don't improve, worsen, or new problems develop.The patient verbalized understanding.    Orders Placed This Encounter  Procedures  . Comprehensive metabolic panel  . CBC  . POCT URINALYSIS DIP (CLINITEK)     Requested  Prescriptions    No prescriptions requested or ordered in this encounter    Follow-up as needed with primary provider.   Camillia Herter, NP

## 2020-10-10 LAB — CBC
Hematocrit: 43 % (ref 34.0–46.6)
Hemoglobin: 14.1 g/dL (ref 11.1–15.9)
MCH: 29.1 pg (ref 26.6–33.0)
MCHC: 32.8 g/dL (ref 31.5–35.7)
MCV: 89 fL (ref 79–97)
Platelets: 211 10*3/uL (ref 150–450)
RBC: 4.84 x10E6/uL (ref 3.77–5.28)
RDW: 13.7 % (ref 11.7–15.4)
WBC: 6.4 10*3/uL (ref 3.4–10.8)

## 2020-10-10 LAB — COMPREHENSIVE METABOLIC PANEL
ALT: 14 IU/L (ref 0–32)
AST: 19 IU/L (ref 0–40)
Albumin/Globulin Ratio: 1.6 (ref 1.2–2.2)
Albumin: 4.5 g/dL (ref 3.8–4.9)
Alkaline Phosphatase: 118 IU/L (ref 44–121)
BUN/Creatinine Ratio: 26 — ABNORMAL HIGH (ref 9–23)
BUN: 15 mg/dL (ref 6–24)
Bilirubin Total: <0.2 mg/dL (ref 0.0–1.2)
CO2: 23 mmol/L (ref 20–29)
Calcium: 9.7 mg/dL (ref 8.7–10.2)
Chloride: 100 mmol/L (ref 96–106)
Creatinine, Ser: 0.58 mg/dL (ref 0.57–1.00)
GFR calc Af Amer: 123 mL/min/{1.73_m2} (ref >59)
GFR calc non Af Amer: 107 mL/min/{1.73_m2} (ref >59)
Globulin, Total: 2.9 g/dL (ref 1.5–4.5)
Glucose: 89 mg/dL (ref 65–99)
Potassium: 3.9 mmol/L (ref 3.5–5.2)
Sodium: 140 mmol/L (ref 134–144)
Total Protein: 7.4 g/dL (ref 6.0–8.5)

## 2020-10-10 NOTE — Progress Notes (Signed)
Please call patient with update.   Kidney function normal.  Liver function normal.   No anemia.   Pending vaginal self-swab.

## 2020-10-12 LAB — CERVICOVAGINAL ANCILLARY ONLY
Bacterial Vaginitis (gardnerella): NEGATIVE
Candida Glabrata: NEGATIVE
Candida Vaginitis: NEGATIVE
Chlamydia: NEGATIVE
Comment: NEGATIVE
Comment: NEGATIVE
Comment: NEGATIVE
Comment: NEGATIVE
Comment: NEGATIVE
Comment: NORMAL
Neisseria Gonorrhea: NEGATIVE
Trichomonas: NEGATIVE

## 2020-10-13 ENCOUNTER — Telehealth: Payer: Self-pay | Admitting: Family

## 2020-10-13 NOTE — Telephone Encounter (Signed)
Pt calling in to inform PCP that her situation has not changed since her last appt. Pt wants to know if medication is needed to help and would like to know what her results mean for the tests done. Comprehensive metabolic panel    CBC   POCT URINALYSIS DIP (CLINITEK)   Pt informed PCP is in Clinic with Patients and will contact pt when possible.   Please advise and thank you

## 2020-10-13 NOTE — Progress Notes (Signed)
Please call patient with update.   Negative for chlamydia, gonorrhea, trichomonas, bacterial vaginosis, and yeast infection.

## 2020-10-14 ENCOUNTER — Encounter: Payer: Self-pay | Admitting: Family

## 2020-10-15 NOTE — Telephone Encounter (Signed)
Spoke w/pt to see if she wanted to be referred to specialist about her issue, due to all labs completed on pt were normal so no medication was able to be prescribed. Pt viewed labs thru Mychart once released, and requested med for normal labs and upset that no one called her about labs. Adv pt that she reviewed her labs through [place] so unless she notified clinic that she wanted call regarding her labs no call was made.

## 2020-11-24 DIAGNOSIS — R87612 Low grade squamous intraepithelial lesion on cytologic smear of cervix (LGSIL): Secondary | ICD-10-CM | POA: Insufficient documentation

## 2020-11-24 DIAGNOSIS — B977 Papillomavirus as the cause of diseases classified elsewhere: Secondary | ICD-10-CM | POA: Insufficient documentation

## 2020-11-24 DIAGNOSIS — N959 Unspecified menopausal and perimenopausal disorder: Secondary | ICD-10-CM | POA: Insufficient documentation

## 2020-11-24 DIAGNOSIS — R1900 Intra-abdominal and pelvic swelling, mass and lump, unspecified site: Secondary | ICD-10-CM | POA: Insufficient documentation

## 2020-11-24 DIAGNOSIS — Z9851 Tubal ligation status: Secondary | ICD-10-CM | POA: Insufficient documentation

## 2021-02-27 LAB — COLOGUARD: COLOGUARD: NEGATIVE

## 2021-03-09 ENCOUNTER — Encounter: Payer: Self-pay | Admitting: Physician Assistant

## 2021-03-09 ENCOUNTER — Other Ambulatory Visit: Payer: Self-pay

## 2021-03-09 ENCOUNTER — Ambulatory Visit (INDEPENDENT_AMBULATORY_CARE_PROVIDER_SITE_OTHER): Payer: Commercial Managed Care - PPO | Admitting: Physician Assistant

## 2021-03-09 VITALS — BP 120/74 | HR 85 | Temp 98.2°F | Resp 18 | Ht 64.0 in | Wt 152.0 lb

## 2021-03-09 DIAGNOSIS — R42 Dizziness and giddiness: Secondary | ICD-10-CM | POA: Diagnosis not present

## 2021-03-09 DIAGNOSIS — B351 Tinea unguium: Secondary | ICD-10-CM | POA: Diagnosis not present

## 2021-03-09 MED ORDER — MECLIZINE HCL 25 MG PO TABS: 25.0000 mg | ORAL_TABLET | Freq: Three times a day (TID) | ORAL | 0 refills | Status: DC | PRN

## 2021-03-09 NOTE — Patient Instructions (Signed)
I encourage you to try meclizine to help you with your dizziness, make sure that you are drinking lots of water, and getting plenty of rest.  We will call you with today's lab results.  To help with your toenail fungus, I do encourage you to purchase an over-the-counter fungal lacquer (nail polish).  Make sure to keep toenails trim, clean and dry.  Kennieth Rad, PA-C Physician Assistant [place] [email or web address]   [facility] Dizziness Los mareos son un problema muy frecuente. Se trata de una sensacin de inestabilidad o desvanecimiento. Puede sentir que se va a desmayar. Los [employer] pueden provocarle una lesin si se tropieza o se cae. Las Engineer, manufacturing de todas las edades pueden sufrir Tree surgeon, Armed forces training and education officer es ms frecuente en los adultos Minor. Esta afeccin puede tener muchas causas, entre las que se pueden mencionar losmedicamentos, la deshidratacin y [place]. Siga estas instrucciones en su casa: Comida y bebida  Beba suficiente lquido como para Theatre manager la orina de color amarillo plido. Esto evita la deshidratacin. Trate de beber ms lquidos transparentes, como agua. No beba alcohol. Limite el consumo de cafena si el mdico se lo indica. Verifique los ingredientes y la informacin nutricional para saber si un alimento o una bebida contienen cafena. Limite el consumo de sal (sodio) si el mdico se lo indica. Verifique los ingredientes y la informacin nutricional para saber si un alimento o una bebida contienen sodio.  Actividad  Evite los movimientos rpidos. Levntese de las sillas con lentitud y apyese hasta sentirse bien. Por la maana, sintese primero a un lado de la cama. Cuando se sienta bien, pngase lentamente de pie mientras se sostiene de algo, hasta que sepa que ha logrado el equilibrio. Mueva las piernas con frecuencia si debe estar de pie en un lugar durante mucho tiempo. Mientras est de pie, contraiga y  relaje los msculos de las piernas. No conduzca vehculos ni opere maquinaria si se siente mareado. Evite agacharse si se siente mareado. En su casa, coloque los objetos de modo que le resulte fcil alcanzarlos sin Office manager.  Estilo de vida No consuma ningn producto que contenga nicotina o tabaco. Estos productos incluyen cigarrillos, tabaco para Higher education careers adviser y aparatos de vapeo, como los Psychologist, sport and exercise. Si necesita ayuda para dejar de fumar, consulte al [employer]. Trate de reducir el nivel de estrs con mtodos como el yoga o la meditacin. Hable con el mdico si necesita ayuda para controlar el nivel de estrs. Instrucciones generales Controle sus mareos para ver si hay cambios. Use los medicamentos de venta libre y los recetados solamente como se lo haya indicado el mdico. Hable con el mdico si cree que los medicamentos que est tomando son la causa de sus mareos. Infrmele a un amigo o a un familiar si se siente mareado. Pdale a esta persona que llame al mdico si observa cambios en su comportamiento. Concurra a [place]. Esto es importante. Comunquese con un mdico si: Los mareos no desaparecen o tiene sntomas nuevos. Los [employer] o la sensacin de Engineer, petroleum. Siente nuseas. Se le redujo la audicin. Tiene fiebre. Dolor o rigidez en el cuello. Los [employer] Corporation derivan en una lesin o una cada. Solicite ayuda de inmediato si: Vomita o tiene diarrea y no puede comer ni beber nada. Tiene dificultad para hablar, caminar, tragar o [employer], las [place]. Se siente constantemente dbil. Tiene cualquier tipo de sangrado. No piensa con claridad o tiene dificultad para armar oraciones. Es posible que un  amigo o un familiar adviertan que esto ocurre. Tiene dolor de pecho, dolor abdominal, sudoracin o Risk manager. Tiene cambios en la visin o le aparece un dolor de cabeza intenso. Estos sntomas pueden representar un problema grave  que constituye Engineer, maintenance (IT). No espere a ver si los sntomas desaparecen. Solicite atencin mdica de inmediato. Comunquese con el servicio de emergencias de su localidad (911 en los [place]). No conduzca por sus propios medios [employer]. Resumen Los mareos son Ardelia Mems sensacin de inestabilidad o desvanecimiento. Esta afeccin puede tener muchas causas, entre las que se pueden [employer], la deshidratacin y [place]. Las Engineer, manufacturing de todas las edades pueden sufrir Tree surgeon, Armed forces training and education officer es ms frecuente en los adultos [place]. Beba suficiente lquido como para Theatre manager la orina de color amarillo plido. No beba alcohol. Evite los movimientos rpidos si se siente mareado. Controle sus mareos para ver si hay cambios. Esta informacin no tiene Marine scientist el consejo del mdico. Asegresede hacerle al mdico cualquier pregunta que tenga. Document Revised: 07/27/2020 Document Reviewed: 07/27/2020 Elsevier Patient Education  2022 [place]. Infeccin por hongos en las uas Fungal Nail Infection La infeccin por hongos en las uas es una infeccin frecuente de las uas de los pies o de las manos. Este trastorno [employer] uas de los pies con ms frecuencia que las uas de las manos. Generalmente afecta al dedo gordo del pie. Ms de Ardelia Mems ua puede infectarse. Esta afeccin puede transmitirse de [place] persona a otra (es contagiosa). Cules son las causas? La causa de esta afeccin es un hongo. Son varios los tipos de hongos que pueden causar la infeccin. Estos hongos son frecuentes en las zonas hmedas y clidas. Si las manos o los pies entran en contacto con los hongos, se pueden introducir en una ruptura de las uas de las manos o de los pies y Psychologist, sport and exercise. Qu incrementa el riesgo? Los siguientes factores pueden hacer que usted sea ms propenso a Public librarian afeccin: Ser hombre. Ser Ardelia Mems persona de edad avanzada. Convivir con alguien que tiene  hongos. Caminar descalzo en zonas donde proliferan hongos, como duchas o vestuarios. Usar zapatos y calcetines que [employer]. Tener una ua lastimada o haberse sometido a una ciruga de uas recientemente. Tener ciertas afecciones, por ejemplo: Pie de atleta. Diabetes. Psoriasis. Mala circulacin. Debilitamiento del sistema de defensa del organismo (sistema inmunitario). Cules son los signos o los sntomas? Los sntomas de esta afeccin incluyen: Una mancha plida sobre la ua. Engrosamiento de la ua. Una ua que se torna amarilla o [place]. [place] uas rugosos o quebradizos. Una ua que se cae. Una ua que se ha desprendido del lecho ungueal. Cmo se diagnostica? Esta afeccin se diagnostica mediante un examen fsico. El mdico podr tomaruna muestra de la ua para examinarla y Hydrographic surveyor si tiene hongos. Cmo se trata? No es necesario realizar tratamiento si la infeccin es leve. Si tiene [employer] uas, el tratamiento puede incluir lo siguiente: Antimicticos que se toman por boca (va oral). Deber tomar los medicamentos durante algunas semanas o meses y no ver los resultados hasta despus de un [place]. Estos medicamentos pueden tener efectos secundarios. Consulte al [employer] a los que debe estar atento. Cremas o esmaltes de uas antimicticos. Se pueden usar junto con los medicamentos antimicticos que se administran por va oral. Tratamiento lser de las uas. Ciruga para extirpar la ua. Esto puede ser necesario en los casos ms  graves de infecciones. La infeccin puede tardar un largo tiempo en desaparecer, habitualmente hastaun ao. Adems, la infeccin puede regresar. Siga estas indicaciones en su casa: Medicamentos [place] o aplquese los medicamentos de venta libre y los recetados solamente como se lo haya indicado el mdico. Consulte al mdico sobre el uso de pomadas mentoladas para las uas de [place]. [place] uas con Camera operator. Lvese y [place] y los pies todos [place]. Mantenga los pies secos: Use calcetines absorbentes y cmbiese los calcetines con frecuencia. Use un tipo de calzado que permita que el aire [place], como sandalias o zapatillas de lona. Deseche los zapatos viejos. No use uas artificiales. Si va al saln de esttica de uas, asegrese de elegir uno en el que se usen instrumentos limpios. Aplquese polvo antimictico en los pies y en los zapatos. Indicaciones generales No comparta elementos personales como toallas o cortauas. No camine descalzo en duchas o vestuarios. Use guantes de goma si est trabajando con sus manos en lugares mojados. Concurra a todas las visitas de control como se lo haya indicado el mdico. Esto es importante. Comunquese con un mdico si: La infeccin no mejora o si empeora despus de varios meses. Resumen La infeccin por hongos en las uas es una infeccin frecuente de las uas de los pies o de las manos. No es necesario realizar tratamiento si la infeccin es leve. Si tiene [employer] uas, el tratamiento puede incluir la administracin de medicamentos por va oral y la aplicacin de un medicamento en las uas. La infeccin puede tardar un largo tiempo en desaparecer, habitualmente hasta un ao. Adems, la infeccin puede regresar. [place] o aplquese los medicamentos de venta libre y los recetados solamente como se lo haya indicado el mdico. Siga las instrucciones de cuidado de las uas a fin de ayudar a Product/process development scientist que la infeccin regrese o se extienda. Esta informacin no tiene Marine scientist el consejo del mdico. Asegresede hacerle al mdico cualquier pregunta que tenga. Document Revised: 02/08/2018 Document Reviewed: 02/08/2018 Elsevier Patient Education  [place].

## 2021-03-09 NOTE — Progress Notes (Signed)
Established Patient Office Visit  Subjective:  Patient ID: Bethany Sheppard, female    DOB: Dec 05, 1970  Age: 50 y.o. MRN: [id number]  CC:  Chief Complaint  Patient presents with   Dizziness    Hx of vertigo    HPI Bethany Sheppard reports that she has been having episodes of dizziness for the last week on a daily basis.  Reports that it mostly happens in the morning, either while she is still in bed or after she has gotten up for the day, but has had 1 episode in the afternoon.  Reports that she just feels dizzy, states that she does not feel the room is spinning or that she is spinning.  Reports that she has not tried anything for relief.  States that she drinks approximately 4 bottles of water a day, sleep is good, stress level is good.  Does endorse that she was diagnosed with vertigo approximately 3 years ago, but states that she has not had an episode of vertigo for the past 6 months and states that she does not feel that this is the same.  States that she is starting to get a darkened area on her right great toenail.  Has not tried anything for relief.  Due to language barrier, an interpreter was present during the history-taking and subsequent discussion (and for part of the physical exam) with this patient.   Past Medical History:  Diagnosis Date   H/O vitamin D deficiency     Past Surgical History:  Procedure Laterality Date   PELVIC LAPAROSCOPY     tubal ligation    Family History  Problem Relation Age of Onset   Hypertension Sister    Cancer Brother        stomach   Thyroid disease Maternal Uncle     Social History   Socioeconomic History   Marital status: Single    Spouse name: Not on file   Number of children: Not on file   Years of education: Not on file   Highest education level: Not on file  Occupational History   Not on file  Tobacco Use   Smoking status: Never   Smokeless tobacco: Never  Vaping Use   Vaping Use: Never used   Substance and Sexual Activity   Alcohol use: Yes    Comment: occ   Drug use: No   Sexual activity: Yes    Birth control/protection: Surgical    Comment: TUBAL LIGATION  Other Topics Concern   Not on file  Social History Narrative   Not on file   Social Determinants of Health   Financial Resource Strain: Not on file  Food Insecurity: Not on file  Transportation Needs: Not on file  Physical Activity: Not on file  Stress: Not on file  Social Connections: Not on file  Intimate Partner Violence: Not on file    Outpatient Medications Prior to Visit  Medication Sig Dispense Refill   calcium carbonate (OS-CAL) 600 MG TABS tablet Take 600 mg by mouth 2 (two) times daily with a meal.     ergocalciferol (VITAMIN D2) 50000 UNITS capsule Take 1 capsule (50,000 Units total) by mouth once a week. 12 capsule 0   tinidazole (TINDAMAX) 500 MG tablet Take four tablets today and four tablets tomorrow at the same time 8 tablet 0   No facility-administered medications prior to visit.    No Known Allergies  ROS Review of Systems  Constitutional: Negative.   HENT: Negative.  Eyes:  Negative for visual disturbance.  Respiratory:  Negative for shortness of breath.   Cardiovascular:  Negative for chest pain.  Gastrointestinal: Negative.   Endocrine: Negative.   Genitourinary: Negative.   Musculoskeletal: Negative.   Skin: Negative.   Allergic/Immunologic: Negative.   Neurological:  Positive for dizziness. Negative for syncope and headaches.  Hematological: Negative.   Psychiatric/Behavioral: Negative.       Objective:    Physical Exam Vitals and nursing note reviewed.  Constitutional:      Appearance: Normal appearance.  HENT:     Head: Normocephalic and atraumatic.     Right Ear: Tympanic membrane, ear canal and external ear normal.     Left Ear: Tympanic membrane, ear canal and external ear normal.     Nose: Nose normal.     Mouth/Throat:     Mouth: Mucous membranes are  moist.     Pharynx: Oropharynx is clear.  Eyes:     Extraocular Movements: Extraocular movements intact.     Conjunctiva/sclera: Conjunctivae normal.     Pupils: Pupils are equal, round, and reactive to light.  Cardiovascular:     Rate and Rhythm: Normal rate and regular rhythm.     Pulses: Normal pulses.     Heart sounds: Normal heart sounds.  Pulmonary:     Effort: Pulmonary effort is normal.     Breath sounds: Normal breath sounds.  Musculoskeletal:        General: Normal range of motion.     Cervical back: Normal range of motion and neck supple.  Skin:    General: Skin is warm and dry.  Neurological:     General: No focal deficit present.     Mental Status: She is alert and oriented to person, place, and time.     Cranial Nerves: Cranial nerves are intact.     Motor: Motor function is intact.     Coordination: Coordination is intact.  Psychiatric:        Mood and Affect: Mood normal.        Behavior: Behavior normal.        Thought Content: Thought content normal.        Judgment: Judgment normal.    BP 120/74 (BP Location: Right Arm, Patient Position: Sitting, Cuff Size: Normal)   Pulse 85   Temp 98.2 F (36.8 C) (Oral)   Resp 18   Ht '5\' 4"'$  (1.626 m)   Wt 152 lb (68.9 kg)   LMP 11/20/2014   SpO2 97%   BMI 26.09 kg/m  Wt Readings from Last 3 Encounters:  03/09/21 152 lb (68.9 kg)  10/09/20 156 lb (70.8 kg)  09/21/20 154 lb (69.9 kg)     Health Maintenance Due  Topic Date Due   HIV Screening  Never done   Hepatitis C Screening  Never done   PAP SMEAR-Modifier  01/26/2013   MAMMOGRAM  06/20/2014   COLONOSCOPY (Pts 45-57yr Insurance coverage will need to be confirmed)  Never done   Zoster Vaccines- Shingrix (1 of 2) Never done    There are no preventive care reminders to display for this patient.  Lab Results  Component Value Date   TSH 2.854 04/23/2014   Lab Results  Component Value Date   WBC 6.4 10/09/2020   HGB 14.1 10/09/2020   HCT 43.0  10/09/2020   MCV 89 10/09/2020   PLT 211 10/09/2020   Lab Results  Component Value Date   NA 140 10/09/2020   K 3.9  10/09/2020   CO2 23 10/09/2020   GLUCOSE 89 10/09/2020   BUN 15 10/09/2020   CREATININE 0.58 10/09/2020   BILITOT <0.2 10/09/2020   ALKPHOS 118 10/09/2020   AST 19 10/09/2020   ALT 14 10/09/2020   PROT 7.4 10/09/2020   ALBUMIN 4.5 10/09/2020   CALCIUM 9.7 10/09/2020   Lab Results  Component Value Date   CHOL 164 04/23/2014   Lab Results  Component Value Date   HDL 66 04/23/2014   Lab Results  Component Value Date   LDLCALC 87 04/23/2014   Lab Results  Component Value Date   TRIG 55 04/23/2014   Lab Results  Component Value Date   CHOLHDL 2.5 04/23/2014   Lab Results  Component Value Date   HGBA1C 5.4 03/28/2013      Assessment & Plan:   Problem List Items Addressed This Visit   None Visit Diagnoses     Dizziness and giddiness    -  Primary   Relevant Medications   meclizine (ANTIVERT) 25 MG tablet   Other Relevant Orders   CBC with Differential/Platelet   Comp. Metabolic Panel (12)   TSH   Onychomycosis           Meds ordered this encounter  Medications   meclizine (ANTIVERT) 25 MG tablet    Sig: Take 1 tablet (25 mg total) by mouth 3 (three) times daily as needed for dizziness.    Dispense:  30 tablet    Refill:  0    Order Specific Question:   Supervising Provider    Answer:   Joya Gaskins, PATRICK E [[id number]]  1. Dizziness and giddiness Patient education given on supportive care, trial meclizine, increase hydration.  Red flags given for prompt reevaluation. - CBC with Differential/Platelet - Comp. Metabolic Panel (12) - TSH - meclizine (ANTIVERT) 25 MG tablet; Take 1 tablet (25 mg total) by mouth 3 (three) times daily as needed for dizziness.  Dispense: 30 tablet; Refill: 0  2. Onychomycosis Patient education given on supportive care, over-the-counter treatments.   I have reviewed the patient's medical history (PMH, PSH,  Social History, Family History, Medications, and allergies) , and have been updated if relevant. I spent 32 minutes reviewing chart and  face to face time with patient.     Follow-up: Return if symptoms worsen or fail to improve.    Loraine Grip Mayers, PA-C

## 2021-03-09 NOTE — Progress Notes (Signed)
Patient has eaten and taken medication today. Patient denies pain at this time. Patient reports being Dx with vertigo 3 years ago. Patient shares last episode was 6 months ago. Most recent episode is the past week with everyday increased dizziness occurring in the morning with the turning of her head.

## 2021-03-10 ENCOUNTER — Telehealth: Payer: Self-pay | Admitting: *Deleted

## 2021-03-10 LAB — CBC WITH DIFFERENTIAL/PLATELET
Basophils Absolute: 0.1 10*3/uL (ref 0.0–0.2)
Basos: 1 %
EOS (ABSOLUTE): 0.1 10*3/uL (ref 0.0–0.4)
Eos: 3 %
Hematocrit: 39.1 % (ref 34.0–46.6)
Hemoglobin: 13 g/dL (ref 11.1–15.9)
Immature Grans (Abs): 0 10*3/uL (ref 0.0–0.1)
Immature Granulocytes: 0 %
Lymphocytes Absolute: 1.4 10*3/uL (ref 0.7–3.1)
Lymphs: 36 %
MCH: 29.3 pg (ref 26.6–33.0)
MCHC: 33.2 g/dL (ref 31.5–35.7)
MCV: 88 fL (ref 79–97)
Monocytes Absolute: 0.4 10*3/uL (ref 0.1–0.9)
Monocytes: 10 %
Neutrophils Absolute: 1.9 10*3/uL (ref 1.4–7.0)
Neutrophils: 50 %
Platelets: 182 10*3/uL (ref 150–450)
RBC: 4.44 x10E6/uL (ref 3.77–5.28)
RDW: 14.2 % (ref 11.7–15.4)
WBC: 3.8 10*3/uL (ref 3.4–10.8)

## 2021-03-10 LAB — COMP. METABOLIC PANEL (12)
AST: 14 IU/L (ref 0–40)
Albumin/Globulin Ratio: 2 (ref 1.2–2.2)
Albumin: 4.3 g/dL (ref 3.8–4.9)
Alkaline Phosphatase: 101 IU/L (ref 44–121)
BUN/Creatinine Ratio: 23 (ref 9–23)
BUN: 12 mg/dL (ref 6–24)
Bilirubin Total: 0.2 mg/dL (ref 0.0–1.2)
Calcium: 9.5 mg/dL (ref 8.7–10.2)
Chloride: 105 mmol/L (ref 96–106)
Creatinine, Ser: 0.53 mg/dL — ABNORMAL LOW (ref 0.57–1.00)
Globulin, Total: 2.1 g/dL (ref 1.5–4.5)
Glucose: 92 mg/dL (ref 65–99)
Potassium: 3.7 mmol/L (ref 3.5–5.2)
Sodium: 141 mmol/L (ref 134–144)
Total Protein: 6.4 g/dL (ref 6.0–8.5)
eGFR: 112 mL/min/{1.73_m2} (ref >59)

## 2021-03-10 LAB — TSH: TSH: 1.6 u[IU]/mL (ref 0.450–4.500)

## 2021-03-10 NOTE — Telephone Encounter (Signed)
-----   Message from Kennieth Rad, [place] sent at 03/10/2021  2:17 PM EDT ----- Please call patient and let her know that her thyroid function, kidney and liver function were within normal limits.  She does show signs of dehydration.  She does not show signs of anemia.

## 2021-03-10 NOTE — Telephone Encounter (Signed)
Medical Assistant used [employer] to contact patient.  Interpreter Name: Marton Redwood #: [id number] Patient is aware of TSH, Kidney, Liver being normal along with Iron. Patient encouraged to increase water to 64oz daily to address dehydration.

## 2021-11-19 ENCOUNTER — Ambulatory Visit
Admission: EM | Admit: 2021-11-19 | Discharge: 2021-11-19 | Disposition: A | Discharge status: Home / Self Care | Payer: Commercial Managed Care - PPO | Attending: Student | Admitting: Student

## 2021-11-19 DIAGNOSIS — N3001 Acute cystitis with hematuria: Secondary | ICD-10-CM

## 2021-11-19 LAB — POCT URINALYSIS DIP (MANUAL ENTRY)
Bilirubin, UA: NEGATIVE
Glucose, UA: NEGATIVE mg/dL
Ketones, POC UA: NEGATIVE mg/dL
Nitrite, UA: NEGATIVE
Protein Ur, POC: NEGATIVE mg/dL
Spec Grav, UA: 1.01 (ref 1.010–1.025)
Urobilinogen, UA: 0.2 E.U./dL
pH, UA: 6.5 (ref 5.0–8.0)

## 2021-11-19 MED ORDER — CEPHALEXIN 500 MG PO CAPS: 500.0000 mg | ORAL_CAPSULE | Freq: Four times a day (QID) | ORAL | 0 refills | Status: DC

## 2021-11-19 NOTE — ED Provider Notes (Signed)
?[facility] ? ? ? ?CSN: [id number] ?Arrival date & time: 11/19/21  1931 ? ? ?  ? ?History   ?Chief Complaint ?Chief Complaint  ?Patient presents with  ? Dysuria  ? ? ?HPI ?Bethany Sheppard is a 51 y.o. female presenting with dysuria for about 6 hours.  History UTI, last over 1 year ago.  Also with suprapubic pressure.  Symptoms for only 6 hours, but consistent with past UTIs and so she came straight in.  She has increased her hydration.  Denies flank pain, fever/chills, vaginal discharge, vaginal rash or lesion, gross hematuria. ? ?HPI ? ?Past Medical History:  ?Diagnosis Date  ? H/O vitamin D deficiency   ? ? ?Patient Active Problem List  ? Diagnosis Date Noted  ? Dizziness and giddiness 03/09/2021  ? Onychomycosis 03/09/2021  ? Acute pain of left knee 09/23/2020  ? Bilateral ovarian cysts 12/31/2014  ? Iron deficiency anemia 06/13/2014  ? History of vitamin D deficiency 04/23/2014  ? Thyroiditis 06/21/2011  ? ? ?Past Surgical History:  ?Procedure Laterality Date  ? PELVIC LAPAROSCOPY    ? tubal ligation  ? ? ?OB History   ? ? Gravida  ?2  ? Para  ?2  ? Term  ?2  ? Preterm  ?   ? AB  ?   ? Living  ?2  ?  ? ? SAB  ?   ? IAB  ?   ? Ectopic  ?   ? Multiple  ?   ? Live Births  ?2  ?   ?  ?  ? ? ? ?Home Medications   ? ?Prior to Admission medications   ?Medication Sig Start Date End Date Taking? Authorizing Provider  ?cephALEXin (KEFLEX) 500 MG capsule Take 1 capsule (500 mg total) by mouth 4 (four) times daily. 11/19/21  Yes Hazel Sams, PA-C  ? ? ?Family History ?Family History  ?Problem Relation Age of Onset  ? Hypertension Sister   ? Cancer Brother   ?     stomach  ? Thyroid disease Maternal Uncle   ? ? ?Social History ?Social History  ? ?Tobacco Use  ? Smoking status: Never  ? Smokeless tobacco: Never  ?Vaping Use  ? Vaping Use: Never used  ?Substance Use Topics  ? Alcohol use: Yes  ?  Comment: occ  ? Drug use: No  ? ? ? ?Allergies   ?Patient has no known allergies. ? ? ?Review of  Systems ?Review of Systems  ?Constitutional:  Negative for chills and fever.  ?HENT:  Negative for sore throat.   ?Eyes:  Negative for pain and redness.  ?Respiratory:  Negative for shortness of breath.   ?Cardiovascular:  Negative for chest pain.  ?Gastrointestinal:  Positive for abdominal pain. Negative for diarrhea, nausea and vomiting.  ?Genitourinary:  Positive for dysuria. Negative for decreased urine volume, difficulty urinating, flank pain, frequency, genital sores, hematuria and urgency.  ?Musculoskeletal:  Negative for back pain.  ?Skin:  Negative for rash.  ?All other systems reviewed and are negative. ? ? ?Physical Exam ?Triage Vital Signs ?ED Triage Vitals  ?Enc Vitals Group  ?   BP 11/19/21 1940 126/83  ?   Pulse Rate 11/19/21 1940 83  ?   Resp 11/19/21 1940 18  ?   Temp 11/19/21 1940 97.9 ?F (36.6 ?C)  ?   Temp Source 11/19/21 1940 Oral  ?   SpO2 11/19/21 1940 98 %  ?   Weight --   ?  Height --   ?   Head Circumference --   ?   Peak Flow --   ?   Pain Score 11/19/21 1941 0  ?   Pain Loc --   ?   Pain Edu? --   ?   Excl. in [place]? --   ? ?No data found. ? ?Updated Vital Signs ?BP 126/83 (BP Location: Left Arm)   Pulse 83   Temp 97.9 ?F (36.6 ?C) (Oral)   Resp 18   LMP 11/20/2014   SpO2 98%  ? ?Visual Acuity ?Right Eye Distance:   ?Left Eye Distance:   ?Bilateral Distance:   ? ?Right Eye Near:   ?Left Eye Near:    ?Bilateral Near:    ? ?Physical Exam ?Vitals reviewed.  ?Constitutional:   ?   General: She is not in acute distress. ?   Appearance: Normal appearance. She is not ill-appearing.  ?HENT:  ?   Head: Normocephalic and atraumatic.  ?   Mouth/Throat:  ?   Mouth: Mucous membranes are moist.  ?   Comments: Moist mucous membranes ?Eyes:  ?   Extraocular Movements: Extraocular movements intact.  ?   Pupils: Pupils are equal, round, and reactive to light.  ?Cardiovascular:  ?   Rate and Rhythm: Normal rate and regular rhythm.  ?   Heart sounds: Normal heart sounds.  ?Pulmonary:  ?   Effort:  Pulmonary effort is normal.  ?   Breath sounds: Normal breath sounds. No wheezing, rhonchi or rales.  ?Abdominal:  ?   General: Bowel sounds are normal. There is no distension.  ?   Palpations: Abdomen is soft. There is no mass.  ?   Tenderness: There is no abdominal tenderness. There is no right CVA tenderness, left CVA tenderness, guarding or rebound.  ?Skin: ?   General: Skin is warm.  ?   Capillary Refill: Capillary refill takes less than 2 seconds.  ?   Comments: Good skin turgor  ?Neurological:  ?   General: No focal deficit present.  ?   Mental Status: She is alert and oriented to person, place, and time.  ?Psychiatric:     ?   Mood and Affect: Mood normal.     ?   Behavior: Behavior normal.  ? ? ? ?UC Treatments / Results  ?Labs ?(all labs ordered are listed, but only abnormal results are displayed) ?Labs Reviewed  ?POCT URINALYSIS DIP (MANUAL ENTRY) - Abnormal; Notable for the following components:  ?    Result Value  ? Color, UA colorless (*)   ? Blood, UA small (*)   ? Leukocytes, UA Small (1+) (*)   ? All other components within normal limits  ? ? ?EKG ? ? ?Radiology ?No results found. ? ?Procedures ?Procedures (including critical care time) ? ?Medications Ordered in UC ?Medications - No data to display ? ?Initial Impression / Assessment and Plan / UC Course  ?I have reviewed the triage vital signs and the nursing notes. ? ?Pertinent labs & imaging results that were available during my care of the patient were reviewed by me and considered in my medical decision making (see chart for details). ? ?  ? ?This patient is a very pleasant 51 y.o. year old female presenting with acute cystitis. Afebrile, nontachycardic, no reproducible abd pain or CVAT. ? ?UA with trace blood and trace leuk. Culture sent.  ? ?Keflex sent. Rec Azo OTC . ? ?ED return precautions discussed. Patient verbalizes understanding and agreement.  ?.  ? ?  Final Clinical Impressions(s) / UC Diagnoses  ? ?Final diagnoses:  ?Acute cystitis  with hematuria  ? ? ? ?Discharge Instructions   ? ?  ?-Start the antibiotic: Keflex, 4x daily x5 days. You can take this with food if you have a sensitive stomach. ?-Pick up some Azo over-the-counter for additional relief ?-Drink plenty of fluids ? ? ?ED Prescriptions   ? ? Medication Sig Dispense Auth. Provider  ? cephALEXin (KEFLEX) 500 MG capsule Take 1 capsule (500 mg total) by mouth 4 (four) times daily. 20 capsule Hazel Sams, PA-C  ? ?  ? ?PDMP not reviewed this encounter. ?  ?Hazel Sams, PA-C ?11/19/21 2003 ? ?

## 2021-11-19 NOTE — ED Triage Notes (Signed)
Pt c/o dysuria, onset 6 hours ago  ?

## 2021-11-19 NOTE — Discharge Instructions (Addendum)
-  Start the antibiotic: Keflex, 4x daily x5 days. You can take this with food if you have a sensitive stomach. ?-Pick up some Azo over-the-counter for additional relief ?-Drink plenty of fluids ?

## 2021-11-20 ENCOUNTER — Telehealth: Payer: Self-pay | Admitting: Emergency Medicine

## 2021-11-20 MED ORDER — CEPHALEXIN 500 MG PO CAPS: 500.0000 mg | ORAL_CAPSULE | Freq: Four times a day (QID) | ORAL | 0 refills | Status: DC

## 2021-11-21 LAB — URINE CULTURE: Culture: <10000 — AB

## 2022-07-27 IMAGING — DX DG KNEE 1-2V*L*
2 series · 2 of 2 positions shown · non-contrast
Comparison: None.

CLINICAL DATA: Left knee pain

EXAM:
LEFT KNEE - 1-2 VIEW

[knee standing ap]
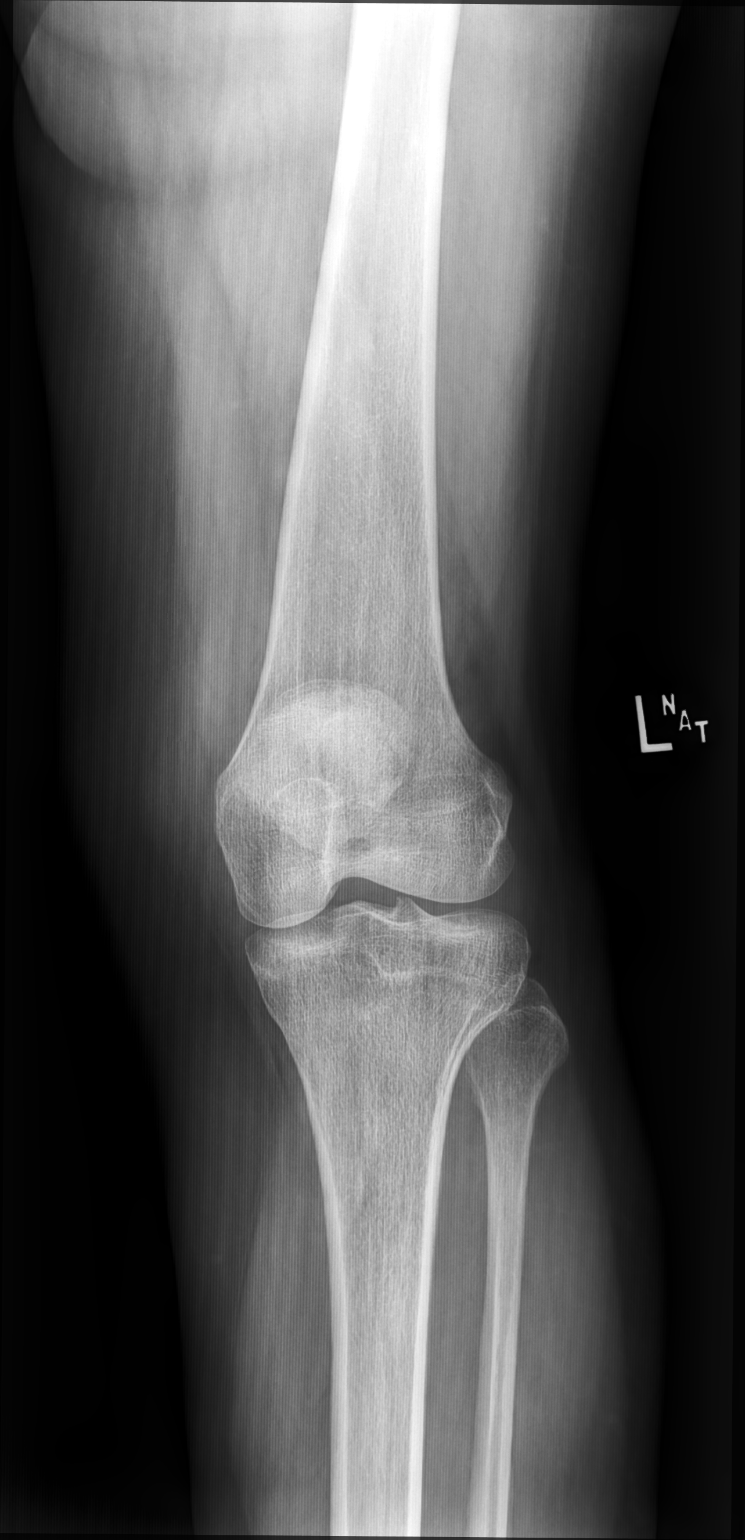

[knee standing lat]
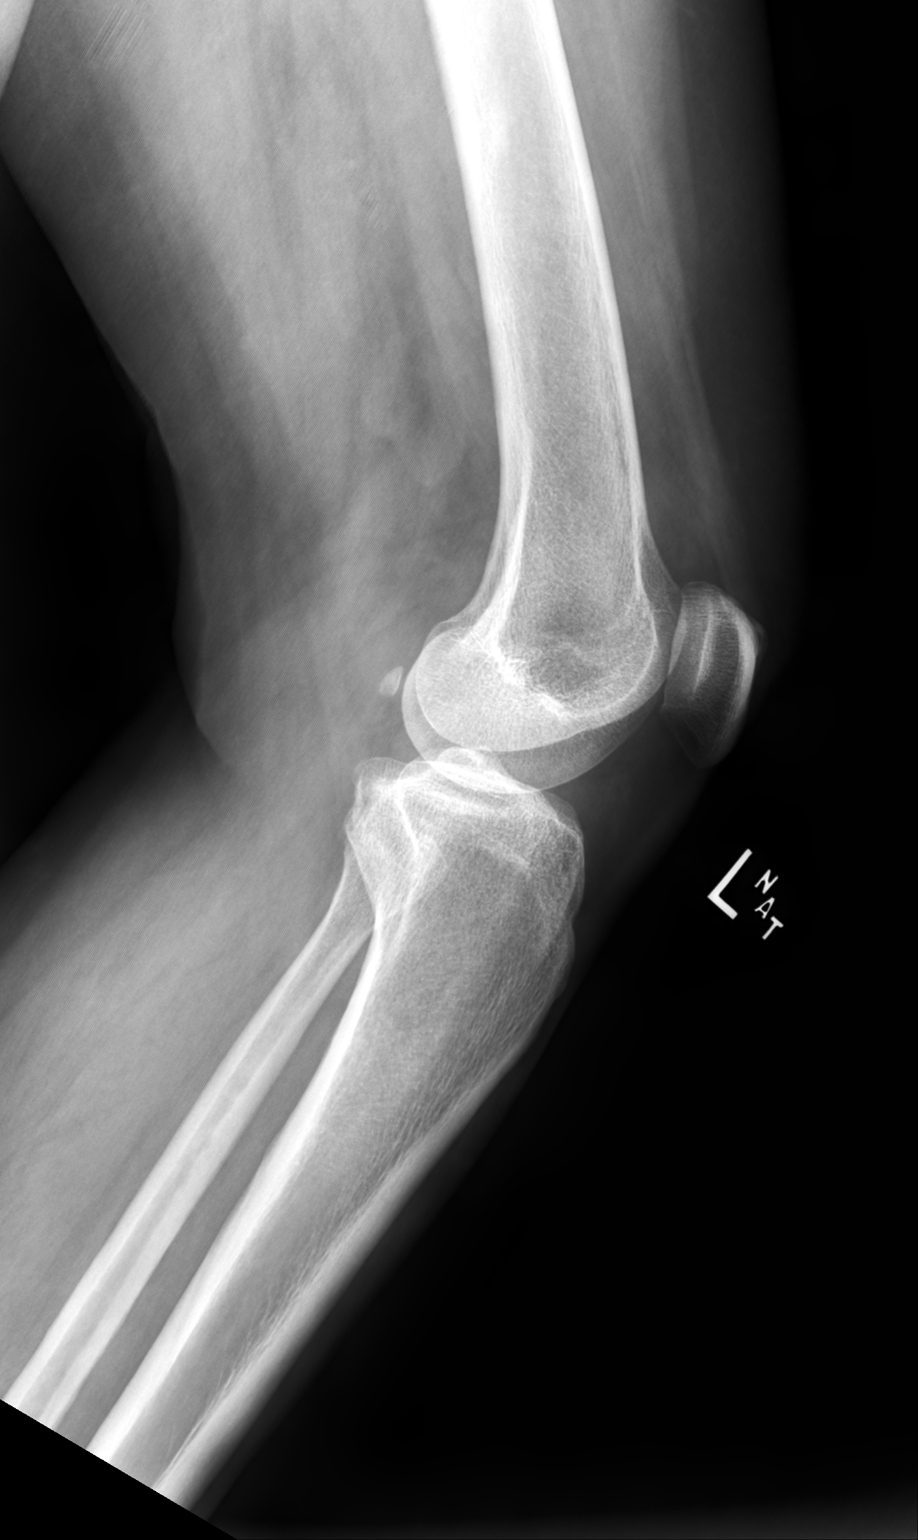

[2 of 2 positions shown; findings below may reference images not displayed]

FINDINGS: Alignment is anatomic. No acute fracture. Joint spaces are
preserved. There is no joint effusion.
IMPRESSION: No significant osseous abnormality.

## 2022-08-04 ENCOUNTER — Ambulatory Visit: Payer: Commercial Managed Care - PPO | Admitting: Emergency Medicine

## 2022-08-17 ENCOUNTER — Ambulatory Visit: Payer: Commercial Managed Care - PPO | Admitting: Emergency Medicine

## 2022-08-17 ENCOUNTER — Encounter: Payer: Self-pay | Admitting: Emergency Medicine

## 2022-08-17 VITALS — BP 118/74 | HR 76 | Temp 98.1°F | Ht 64.0 in | Wt 152.2 lb

## 2022-08-17 DIAGNOSIS — Z13228 Encounter for screening for other metabolic disorders: Secondary | ICD-10-CM | POA: Diagnosis not present

## 2022-08-17 DIAGNOSIS — Z114 Encounter for screening for human immunodeficiency virus [HIV]: Secondary | ICD-10-CM

## 2022-08-17 DIAGNOSIS — Z1159 Encounter for screening for other viral diseases: Secondary | ICD-10-CM | POA: Diagnosis not present

## 2022-08-17 DIAGNOSIS — Z13220 Encounter for screening for lipoid disorders: Secondary | ICD-10-CM | POA: Diagnosis not present

## 2022-08-17 DIAGNOSIS — Z23 Encounter for immunization: Secondary | ICD-10-CM

## 2022-08-17 DIAGNOSIS — Z130 Encounter for screening for diseases of the blood and blood-forming organs and certain disorders involving the immune mechanism: Secondary | ICD-10-CM

## 2022-08-17 DIAGNOSIS — Z1329 Encounter for screening for other suspected endocrine disorder: Secondary | ICD-10-CM | POA: Diagnosis not present

## 2022-08-17 DIAGNOSIS — Z1231 Encounter for screening mammogram for malignant neoplasm of breast: Secondary | ICD-10-CM

## 2022-08-17 DIAGNOSIS — M549 Dorsalgia, unspecified: Secondary | ICD-10-CM

## 2022-08-17 DIAGNOSIS — Z0000 Encounter for general adult medical examination without abnormal findings: Secondary | ICD-10-CM | POA: Diagnosis not present

## 2022-08-17 LAB — CBC WITH DIFFERENTIAL/PLATELET
Basophils Absolute: 0 10*3/uL (ref 0.0–0.1)
Basophils Relative: 0.8 % (ref 0.0–3.0)
Eosinophils Absolute: 0.1 10*3/uL (ref 0.0–0.7)
Eosinophils Relative: 2.4 % (ref 0.0–5.0)
HCT: 42.3 % (ref 36.0–46.0)
Hemoglobin: 13.7 g/dL (ref 12.0–15.0)
Lymphocytes Relative: 35.6 % (ref 12.0–46.0)
Lymphs Abs: 1.5 10*3/uL (ref 0.7–4.0)
MCHC: 32.4 g/dL (ref 30.0–36.0)
MCV: 89.8 fl (ref 78.0–100.0)
Monocytes Absolute: 0.4 10*3/uL (ref 0.1–1.0)
Monocytes Relative: 9.2 % (ref 3.0–12.0)
Neutro Abs: 2.2 10*3/uL (ref 1.4–7.7)
Neutrophils Relative %: 52 % (ref 43.0–77.0)
Platelets: 197 10*3/uL (ref 150.0–400.0)
RBC: 4.72 Mil/uL (ref 3.87–5.11)
RDW: 14.5 % (ref 11.5–15.5)
WBC: 4.2 10*3/uL (ref 4.0–10.5)

## 2022-08-17 LAB — COMPREHENSIVE METABOLIC PANEL
ALT: 18 U/L (ref 0–35)
AST: 21 U/L (ref 0–37)
Albumin: 4.4 g/dL (ref 3.5–5.2)
Alkaline Phosphatase: 87 U/L (ref 39–117)
BUN: 15 mg/dL (ref 6–23)
CO2: 28 mEq/L (ref 19–32)
Calcium: 10 mg/dL (ref 8.4–10.5)
Chloride: 104 mEq/L (ref 96–112)
Creatinine, Ser: 0.58 mg/dL (ref 0.40–1.20)
GFR: 103.61 mL/min (ref >60.00)
Glucose, Bld: 88 mg/dL (ref 70–99)
Potassium: 3.7 mEq/L (ref 3.5–5.1)
Sodium: 142 mEq/L (ref 135–145)
Total Bilirubin: 0.3 mg/dL (ref 0.2–1.2)
Total Protein: 7.3 g/dL (ref 6.0–8.3)

## 2022-08-17 LAB — LIPID PANEL
Cholesterol: 228 mg/dL — ABNORMAL HIGH (ref 0–200)
HDL: 58.4 mg/dL (ref >39.00)
LDL Cholesterol: 148 mg/dL — ABNORMAL HIGH (ref 0–99)
NonHDL: 169.39
Total CHOL/HDL Ratio: 4
Triglycerides: 108 mg/dL (ref 0.0–149.0)
VLDL: 21.6 mg/dL (ref 0.0–40.0)

## 2022-08-17 LAB — HEMOGLOBIN A1C: Hgb A1c MFr Bld: 5.7 % (ref 4.6–6.5)

## 2022-08-17 MED ORDER — CYCLOBENZAPRINE HCL 10 MG PO TABS: 10.0000 mg | ORAL_TABLET | Freq: Every day | ORAL | 0 refills | Status: AC

## 2022-08-17 NOTE — Patient Instructions (Signed)

## 2022-08-17 NOTE — Progress Notes (Signed)
[name] 52 y.o.   Chief Complaint  Patient presents with   New Patient (Initial Visit)    Patient requesting physical , back pain constant     HISTORY OF PRESENT ILLNESS: This is a 52 y.o. female first visit to this office, here to establish care with me.  Requesting physical. Originally from [place], in this country for 25 years. No chronic medical problems.  No chronic medications Healthy female with a healthy lifestyle Has some chronic back pain most likely related to work duties.  HPI   Prior to Admission medications   Medication Sig Start Date End Date Taking? Authorizing Provider  cephALEXin (KEFLEX) 500 MG capsule Take 1 capsule (500 mg total) by mouth 4 (four) times daily. 11/20/21   LampteyMyrene Galas, MD    No Known Allergies  Patient Active Problem List   Diagnosis Date Noted   Onychomycosis 03/09/2021   Bilateral ovarian cysts 12/31/2014   Iron deficiency anemia 06/13/2014   History of vitamin D deficiency 04/23/2014    Past Medical History:  Diagnosis Date   H/O vitamin D deficiency     Past Surgical History:  Procedure Laterality Date   PELVIC LAPAROSCOPY     tubal ligation    Social History   Socioeconomic History   Marital status: Single    Spouse name: Not on file   Number of children: Not on file   Years of education: Not on file   Highest education level: Not on file  Occupational History   Not on file  Tobacco Use   Smoking status: Never   Smokeless tobacco: Never  Vaping Use   Vaping Use: Never used  Substance and Sexual Activity   Alcohol use: Yes    Comment: occ   Drug use: No   Sexual activity: Yes    Birth control/protection: Surgical    Comment: TUBAL LIGATION  Other Topics Concern   Not on file  Social History Narrative   Not on file   Social Determinants of Health   Financial Resource Strain: Not on file  Food Insecurity: Not on file  Transportation Needs: Not on file  Physical Activity: Not on  file  Stress: Not on file  Social Connections: Not on file  Intimate Partner Violence: Not on file    Family History  Problem Relation Age of Onset   Hypertension Sister    Cancer Brother        stomach   Thyroid disease Maternal Uncle      Review of Systems  Constitutional: Negative.  Negative for chills and fever.  HENT: Negative.  Negative for congestion and sore throat.   Respiratory: Negative.  Negative for cough and shortness of breath.   Cardiovascular: Negative.  Negative for chest pain and palpitations.  Gastrointestinal:  Negative for abdominal pain, diarrhea, nausea and vomiting.  Musculoskeletal:  Positive for back pain.  Skin: Negative.  Negative for rash.  Neurological: Negative.  Negative for dizziness and headaches.  All other systems reviewed and are negative.  Today's Vitals   08/17/22 1302  BP: 118/74  Pulse: 76  Temp: 98.1 F (36.7 C)  TempSrc: Oral  SpO2: 97%  Weight: 152 lb 4 oz (69.1 kg)  Height: '5\' 4"'$  (1.626 m)   Body mass index is 26.13 kg/m.   Physical Exam Vitals reviewed.  Constitutional:      Appearance: Normal appearance.  HENT:     Head: Normocephalic.     Right Ear: Tympanic membrane, ear canal  and external ear normal.     Left Ear: Tympanic membrane, ear canal and external ear normal.     Mouth/Throat:     Mouth: Mucous membranes are moist.     Pharynx: Oropharynx is clear.  Eyes:     Extraocular Movements: Extraocular movements intact.     Conjunctiva/sclera: Conjunctivae normal.     Pupils: Pupils are equal, round, and reactive to light.  Cardiovascular:     Rate and Rhythm: Normal rate and regular rhythm.     Pulses: Normal pulses.     Heart sounds: Normal heart sounds.  Pulmonary:     Effort: Pulmonary effort is normal.     Breath sounds: Normal breath sounds.  Abdominal:     Palpations: Abdomen is soft.  Musculoskeletal:     Cervical back: No tenderness.     Right lower leg: No edema.     Left lower leg: No  edema.  Lymphadenopathy:     Cervical: No cervical adenopathy.  Skin:    General: Skin is warm and dry.  Neurological:     General: No focal deficit present.     Mental Status: She is alert and oriented to person, place, and time.  Psychiatric:        Mood and Affect: Mood normal.        Behavior: Behavior normal.      ASSESSMENT & PLAN: Problem List Items Addressed This Visit   None Visit Diagnoses     Routine general medical examination at a health care facility    -  Primary   Relevant Orders   CBC with Differential   Comprehensive metabolic panel   Hemoglobin A1c   Lipid panel   Need for vaccination       Relevant Orders   Flu Vaccine QUAD 6+ mos PF IM (Fluarix Quad PF)   Need for hepatitis C screening test       Relevant Orders   Hepatitis C antibody screen   Screening for HIV (human immunodeficiency virus)       Relevant Orders   HIV antibody   Visit for screening mammogram       Relevant Orders   Mammogram Digital Screening   Screening for deficiency anemia       Relevant Orders   CBC with Differential   Screening for lipoid disorders       Relevant Orders   Lipid panel   Screening for endocrine, metabolic and immunity disorder       Relevant Orders   Comprehensive metabolic panel   Hemoglobin A1c   Musculoskeletal back pain       Relevant Medications   cyclobenzaprine (FLEXERIL) 10 MG tablet      Modifiable risk factors discussed with patient. Anticipatory guidance according to age provided. The following topics were also discussed: Social Determinants of Health Smoking.  Non-smoker Diet and nutrition.  Good eating habits Benefits of exercise Cancer screening and need for breast cancer screening with mammogram.  Negative Cologuard last year Vaccinations reviewed recommendations Cardiovascular risk assessment and need for blood work Mental health including depression and anxiety Fall and accident prevention  Patient Instructions   Mantenimiento de Technical sales engineer en [place] [employer], Female Adoptar un estilo de vida saludable y recibir atencin preventiva son importantes para promover la salud y Musician. Consulte al mdico sobre: El esquema adecuado para hacerse pruebas y exmenes peridicos. Cosas que puede hacer por su cuenta para prevenir enfermedades y Luis M. Cintron sano. Qu debo saber  sobre la [place], el peso y el ejercicio? Consuma una dieta saludable  Consuma una dieta que incluya muchas verduras, frutas, productos lcteos con bajo contenido de [place] y Advertising account planner. No consuma muchos alimentos ricos en grasas slidas, azcares agregados o sodio. Mantenga un peso saludable El ndice de masa muscular [facility]) se [place] para identificar problemas de [place]. Proporciona una estimacin de la grasa corporal basndose en el peso y la altura. Su mdico puede ayudarle a Radiation protection practitioner [place] y a Scientist, forensic o Theatre manager un peso saludable. Haga ejercicio con regularidad Haga ejercicio con regularidad. Esta es una de las prcticas ms importantes que puede hacer por su salud. La [employer] de los adultos deben seguir estas pautas: Optometrist, al menos, 150 minutos de actividad fsica por semana. El ejercicio debe aumentar la frecuencia cardaca y Nature conservation officer transpirar (ejercicio de intensidad moderada). Hacer ejercicios de fortalecimiento por lo [employer] por semana. Agregue esto a su plan de ejercicio de intensidad moderada. Pase menos tiempo sentada. Incluso la actividad fsica ligera puede ser beneficiosa. Controle sus niveles de colesterol y lpidos en la sangre Comience a realizarse anlisis de lpidos y Research officer, trade union en la sangre a los 1 aos y luego reptalos cada 5 aos. Hgase controlar los niveles de colesterol con mayor frecuencia si: Sus niveles de lpidos y colesterol son altos. Es mayor de 9 aos. Presenta un alto riesgo de padecer enfermedades cardacas. Qu debo saber sobre las pruebas de deteccin del  cncer? Segn su historia clnica y sus antecedentes familiares, es posible que deba realizarse pruebas de deteccin del cncer en diferentes edades. Esto puede incluir pruebas de deteccin de lo siguiente: Cncer de mama. Cncer de cuello uterino. Cncer colorrectal. Cncer de piel. Cncer de pulmn. Qu debo saber sobre la enfermedad cardaca, la diabetes y la hipertensin arterial? Presin arterial y enfermedad cardaca La hipertensin arterial causa enfermedades cardacas y [place] el riesgo de accidente cerebrovascular. Es ms probable que esto se manifieste en las personas que tienen lecturas de presin arterial alta o tienen sobrepeso. Hgase controlar la presin arterial: Cada 3 a 5 aos si tiene entre 18 y 82 aos. Todos los aos si es mayor de 40 aos. Diabetes Realcese exmenes de deteccin de la diabetes con regularidad. Este anlisis revisa el nivel de azcar en la sangre en [place]. Hgase las pruebas de deteccin: Cada tres aos despus de los 22 aos de edad si tiene un peso normal y un bajo riesgo de padecer diabetes. Con ms frecuencia y a partir de [place] edad inferior si tiene sobrepeso o un alto riesgo de padecer diabetes. Qu debo saber sobre la prevencin de infecciones? Hepatitis B Si tiene un riesgo ms alto de contraer hepatitis B, debe someterse a un examen de deteccin de este virus. Hable con el mdico para averiguar si tiene riesgo de contraer la infeccin por hepatitis B. Hepatitis C Se recomienda el anlisis a: [employer] 1945 y 1965. Todas las personas que tengan un riesgo de haber contrado hepatitis C. Enfermedades de transmisin sexual (ETS) Hgase las pruebas de Programme researcher, broadcasting/film/video de ITS, incluidas la gonorrea y la clamidia, si: Es sexualmente activa y es menor de 62 aos. Es mayor de 13 aos, y Investment banker, operational informa que corre riesgo de tener este tipo de infecciones. La actividad sexual ha cambiado desde que le hicieron la ltima prueba de  deteccin y tiene un riesgo mayor de Best boy clamidia o Radio broadcast assistant. Pregntele al mdico si usted tiene riesgo. Pregntele al mdico si usted tiene  un alto riesgo de contraer VIH. El mdico tambin puede recomendarle un medicamento recetado para ayudar a evitar la infeccin por el VIH. Si elige tomar medicamentos para prevenir el VIH, primero debe [employer] de deteccin del VIH. Luego debe hacerse anlisis cada 3 meses mientras est tomando los medicamentos. Embarazo Si est por dejar de Librarian, academic (fase premenopusica) y usted puede quedar [place], busque asesoramiento antes de [place]. Tome de 400 a 800 microgramos (mcg) de cido [employer] si [place]. Pida mtodos de control de la natalidad (anticonceptivos) si desea evitar un embarazo no deseado. Osteoporosis y [place] La osteoporosis es una enfermedad en la que los huesos pierden los minerales y la fuerza por el avance de la edad. El resultado pueden ser fracturas en los [place]. Si tiene 58 aos o ms, o si est en riesgo de sufrir osteoporosis y fracturas, pregunte a su mdico si debe: Hacerse pruebas de deteccin de prdida sea. Tomar un suplemento de calcio o de vitamina D para reducir el riesgo de fracturas. Recibir terapia de reemplazo hormonal (TRH) para tratar los sntomas de la menopausia. Siga estas indicaciones en su casa: Consumo de alcohol No beba alcohol si: Su mdico le indica no hacerlo. Est embarazada, puede estar embarazada o est tratando de [place]. Si bebe alcohol: Limite la cantidad que bebe a lo siguiente: De 0 a 1 bebida por da. Sepa cunta cantidad de alcohol hay en las bebidas que toma. En los [place], una medida equivale a una botella de cerveza de 12 oz (355 ml), un vaso de vino de 5 oz (148 ml) o un vaso de una bebida alcohlica de alta graduacin de 1 oz (44 ml). Estilo de vida No consuma ningn producto que contenga nicotina o tabaco. Estos  productos incluyen cigarrillos, tabaco para Higher education careers adviser y aparatos de vapeo, como los Psychologist, sport and exercise. Si necesita ayuda para dejar de consumir estos productos, consulte al mdico. No consuma drogas. No comparta agujas. Solicite ayuda a su mdico si necesita apoyo o informacin para abandonar las drogas. Indicaciones generales Realcese los estudios de rutina de la salud, dentales y de Public librarian. [place]. Infrmele a su mdico si: Se siente deprimida con frecuencia. Alguna vez ha sido vctima de [place] o no se siente seguro en su casa. Resumen Adoptar un estilo de vida saludable y recibir atencin preventiva son importantes para promover la salud y Musician. Siga las instrucciones del mdico acerca de una dieta saludable, el ejercicio y la realizacin de pruebas o exmenes para Engineer, building services. Siga las instrucciones del mdico con respecto al control del colesterol y la presin arterial. Esta informacin no tiene Marine scientist el consejo del mdico. Asegrese de hacerle al mdico cualquier pregunta que tenga. Document Revised: 01/07/2021 Document Reviewed: 01/07/2021 Elsevier Patient Education  [place], MD [facility]

## 2022-08-18 LAB — HEPATITIS C ANTIBODY: Hepatitis C Ab: NONREACTIVE

## 2022-08-18 LAB — HIV ANTIBODY (ROUTINE TESTING W REFLEX): HIV 1&2 Ab, 4th Generation: NONREACTIVE

## 2022-10-19 ENCOUNTER — Ambulatory Visit: Payer: Commercial Managed Care - PPO | Admitting: Emergency Medicine

## 2022-10-19 ENCOUNTER — Ambulatory Visit: Payer: Commercial Managed Care - PPO | Admitting: Physician Assistant

## 2022-11-23 ENCOUNTER — Ambulatory Visit (INDEPENDENT_AMBULATORY_CARE_PROVIDER_SITE_OTHER): Payer: Commercial Managed Care - PPO | Admitting: Physical Medicine and Rehabilitation

## 2022-11-23 ENCOUNTER — Encounter: Payer: Self-pay | Admitting: Physical Medicine and Rehabilitation

## 2022-11-23 DIAGNOSIS — G8929 Other chronic pain: Secondary | ICD-10-CM

## 2022-11-23 DIAGNOSIS — M7918 Myalgia, other site: Secondary | ICD-10-CM | POA: Diagnosis not present

## 2022-11-23 DIAGNOSIS — M542 Cervicalgia: Secondary | ICD-10-CM | POA: Diagnosis not present

## 2022-11-23 DIAGNOSIS — M546 Pain in thoracic spine: Secondary | ICD-10-CM | POA: Diagnosis not present

## 2022-11-23 MED ORDER — MELOXICAM 15 MG PO TABS: 15.0000 mg | ORAL_TABLET | Freq: Every day | ORAL | 0 refills | Status: DC

## 2022-11-23 NOTE — Progress Notes (Signed)
Bethany Sheppard - 52 y.o. female MRN [id number]  Date of birth: 1971/06/10  Office Visit Note: Visit Date: 11/23/2022 PCP: Georgina Quint, MD Referred by: Georgina Quint, *  Subjective: Chief Complaint  Patient presents with   Neck - Pain   HPI: Bethany Sheppard is a 52 y.o. female who comes in today as a self referral for evaluation of chronic, worsening and severe right sided thoracic back pain radiating up to right upper thoracic region, and right shoulder. Also reports soreness to bilateral neck. Pain ongoing for several months, started in November. Pain worsens with movement and activity. She describes pain as sore, aching and tight, currently rates as 5 out of 10. Some relief of pain with home exercise regimen, rest and use of over the counter medications. Good relief of pain with ice and Ibuprofen. No relief of pain with Flexeril. Patient attending chiropractic sessions with Clide Dales at [facility] She reports temporary relief of pain with these treatments. 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 States sore feeling to entire back and neck, does improve with physical activity. States she feels much better with consistent exercise. Patient denies focal weakness, numbness and tingling. No recent trauma or falls.    Review of Systems  Musculoskeletal:  Positive for back pain, myalgias and neck pain.  Neurological:  Negative for tingling, sensory change, focal weakness and weakness.  All other systems reviewed and are negative.  Otherwise per HPI.  Assessment & Plan: Visit Diagnoses:    ICD-10-CM   1. Chronic right-sided thoracic back pain  M54.6 Ambulatory referral to Physical Therapy   G89.29     2. Neck pain, chronic  M54.2 Ambulatory referral to Physical Therapy   G89.29     3. Myofascial pain syndrome  M79.18 Ambulatory referral to Physical Therapy       Plan: Findings:  Chronic, worsening and severe  right sided thoracic back pain radiating up to right upper thoracic region, and right shoulder.  No radicular symptoms down the arms.  Patient continues to have severe pain despite good conservative therapy such as chiropractic treatments, home exercise regimen, rest and use of medications.  Patient's clinical presentation and exam are consistent with myofascial pain syndrome.  Multiple palpable trigger points noted to right levator scapula and trapezius regions upon exam today.  Tenderness noted to right thoracic paraspinal region as well.  No focal weakness noted upon exam today. Next step is to place order for formal physical therapy with our in-house team.  I do feel patient would benefit from manual treatments and dry needling.  Also feel she would benefit from establishing consistent exercise regimen.  I will discuss medication management with patient today, did prescribe Meloxicam.  I would like to see her back in approximately 4 weeks for reevaluation.  If her pain persists we would consider obtaining x ray imaging, possible MRI imaging if needed.  Would consider epidural steroid injection if warranted. No red flag symptoms noted upon exam today.     Meds & Orders:  Meds ordered this encounter  Medications   meloxicam (MOBIC) 15 MG tablet    Sig: Take 1 tablet (15 mg total) by mouth daily.    Dispense:  30 tablet    Refill:  0    Orders Placed This Encounter  Procedures   Ambulatory referral to Physical Therapy    Follow-up: Return for 4 week follow up for re-evaluation.   Procedures: No procedures performed  Clinical History: No specialty comments available.   She reports that she has never smoked. She has never used smokeless tobacco.  Recent Labs    08/17/22 1337  HGBA1C 5.7    Objective:  VS:  HT:    WT:   BMI:     BP:   HR: bpm  TEMP: ( )  RESP:  Physical Exam Vitals and nursing note reviewed.  HENT:     Head: Normocephalic and atraumatic.     Right Ear:  External ear normal.     Left Ear: External ear normal.     Nose: Nose normal.     Mouth/Throat:     Mouth: Mucous membranes are moist.  Eyes:     Extraocular Movements: Extraocular movements intact.  Cardiovascular:     Rate and Rhythm: Normal rate.     Pulses: Normal pulses.  Pulmonary:     Effort: Pulmonary effort is normal.  Abdominal:     General: Abdomen is flat. There is no distension.  Musculoskeletal:        General: Tenderness present.     Cervical back: Tenderness present.     Comments: Discomfort noted with flexion and extension. Patient has good strength in the upper extremities including 5 out of 5 strength in wrist extension, long finger flexion and APB. There is no atrophy of the hands intrinsically.  Sensation intact bilaterally. Multiple palpable trigger points noted to right levator scapulae and trapezius muscles. Negative Hoffman's sign. Negative Spurling's sign.    Skin:    General: Skin is warm and dry.     Capillary Refill: Capillary refill takes less than 2 seconds.  Neurological:     General: No focal deficit present.     Mental Status: She is alert and oriented to person, place, and time.  Psychiatric:        Mood and Affect: Mood normal.        Behavior: Behavior normal.     Ortho Exam  Imaging: No results found.  Past Medical/Family/Surgical/Social History: Medications & Allergies reviewed per EMR, new medications updated. Patient Active Problem List   Diagnosis Date Noted   Onychomycosis 03/09/2021   Bilateral ovarian cysts 12/31/2014   Iron deficiency anemia 06/13/2014   History of vitamin D deficiency 04/23/2014   Past Medical History:  Diagnosis Date   H/O vitamin D deficiency    Family History  Problem Relation Age of Onset   Hypertension Sister    Cancer Brother        stomach   Thyroid disease Maternal Uncle    Past Surgical History:  Procedure Laterality Date   PELVIC LAPAROSCOPY     tubal ligation   Social History    Occupational History   Not on file  Tobacco Use   Smoking status: Never   Smokeless tobacco: Never  Vaping Use   Vaping Use: Never used  Substance and Sexual Activity   Alcohol use: Yes    Comment: occ   Drug use: No   Sexual activity: Yes    Birth control/protection: Surgical    Comment: TUBAL LIGATION

## 2022-11-23 NOTE — Progress Notes (Signed)
Functional Pain Scale - descriptive words and definitions  Distressing (6)    Pain is present/unable to complete most ADLs limited by pain/sleep is difficult and active distraction is only marginal. Moderate range order  Average Pain 5  Neck pain on both sides. Hurts to look both ways and up and down

## 2022-12-21 ENCOUNTER — Ambulatory Visit: Payer: Commercial Managed Care - PPO | Admitting: Physical Medicine and Rehabilitation

## 2022-12-21 DIAGNOSIS — G8929 Other chronic pain: Secondary | ICD-10-CM

## 2022-12-21 DIAGNOSIS — M7918 Myalgia, other site: Secondary | ICD-10-CM

## 2022-12-21 DIAGNOSIS — M542 Cervicalgia: Secondary | ICD-10-CM

## 2022-12-21 DIAGNOSIS — M546 Pain in thoracic spine: Secondary | ICD-10-CM | POA: Diagnosis not present

## 2022-12-21 NOTE — Progress Notes (Signed)
Functional Pain Scale - descriptive words and definitions  Moderate (4)   Constantly aware of pain, can complete ADLs with modification/sleep marginally affected at times/passive distraction is of no use, but active distraction gives some relief. Moderate range order  Average Pain 7  Neck pain with radiation in shoulders and thoracic pain in the middle of the back

## 2022-12-21 NOTE — Progress Notes (Signed)
Franny Johnda Sinn - 52 y.o. female MRN [id number]  Date of birth: [birth year]  Office Visit Note: Visit Date: 12/21/2022 PCP: Georgina Quint, MD Referred by: Georgina Quint, *  Subjective: Chief Complaint  Patient presents with   Neck - Pain   Middle Back - Pain   HPI: Bethany Sheppard is a 52 y.o. female who comes in today for evaluation of chronic right sided thoracic pain radiating up back to right shoulder and bilateral neck. Pain ongoing for several months, worsens with movement and activity. She describes pain as sore, aching and tight sensation, currently rates as 3 out of 10. Some relief of pain with home exercise regimen, rest and use of medications. She reports greater than 50% relief of pain with Meloxicam. States she is sleeping better and reports increased functional ability. I placed order for formal physical therapy during our last office visit, PT has attempted to call her but was unable to reach patient. 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 States she is now able to complete job duties without severe pain. Patient denies focal weakness, numbness and tingling. No recent trauma or falls.    Review of Systems  Musculoskeletal:  Positive for back pain, myalgias and neck pain.  Neurological:  Negative for tingling, sensory change, focal weakness and weakness.  All other systems reviewed and are negative.  Otherwise per HPI.  Assessment & Plan: Visit Diagnoses:    ICD-10-CM   1. Chronic right-sided thoracic back pain  M54.6    G89.29     2. Neck pain, chronic  M54.2    G89.29     3. Myofascial pain syndrome  M79.18        Plan: Findings:  Chronic right sided thoracic back pain radiating up back to right shoulder and bilateral neck. Significant relief of pain with Meloxicam, however she continues to experience discomfort. Patients clinical presentation and exam are consistent with myofascial pain syndrome.  She is tender upon palpation of right thoracic paraspinal region today, multiple palpable trigger points noted to right levator scapulae and trapezius muscles. I assisted patient in scheduling physical therapy appointment today, do feel she would benefit from dry needling treatments. I also discussed medication management and refilled Meloxicam. I encouraged patient to remain active and to attend PT as scheduled. Patient to follow up with [place] as needed. If her symptoms persists would consider obtaining thoracic x-rays/MRI imaging. No red flag symptoms noted upon exam today.    Meds & Orders: No orders of the defined types were placed in this encounter.  No orders of the defined types were placed in this encounter.   Follow-up: Return if symptoms worsen or fail to improve.   Procedures: No procedures performed      Clinical History: No specialty comments available.   She reports that she has never smoked. She has never used smokeless tobacco.  Recent Labs    08/17/22 1337  HGBA1C 5.7    Objective:  VS:  HT:    WT:   BMI:     BP:   HR: bpm  TEMP: ( )  RESP:  Physical Exam Vitals and nursing note reviewed.  HENT:     Head: Normocephalic and atraumatic.     Right Ear: External ear normal.     Left Ear: External ear normal.     Nose: Nose normal.     Mouth/Throat:     Mouth: Mucous membranes are moist.  Eyes:  Extraocular Movements: Extraocular movements intact.  Cardiovascular:     Rate and Rhythm: Normal rate.     Pulses: Normal pulses.  Pulmonary:     Effort: Pulmonary effort is normal.  Abdominal:     General: Abdomen is flat. There is no distension.  Musculoskeletal:        General: Tenderness present.     Cervical back: Tenderness present.     Comments: Normal thoracic kyphosis noted. No tenderness noted upon palpation of spinous processes. No step off deformity. No pain noted with flexion, extension, lateral bending and rotation. Multiple palpable trigger points  noted to right levator scapulae and trapezius regions. Good strength noted to bilateral upper extremities.    Skin:    General: Skin is warm and dry.     Capillary Refill: Capillary refill takes less than 2 seconds.  Neurological:     General: No focal deficit present.     Mental Status: She is alert and oriented to person, place, and time.  Psychiatric:        Mood and Affect: Mood normal.        Behavior: Behavior normal.     Ortho Exam  Imaging: No results found.  Past Medical/Family/Surgical/Social History: Medications & Allergies reviewed per EMR, new medications updated. Patient Active Problem List   Diagnosis Date Noted   Onychomycosis 03/09/2021   Bilateral ovarian cysts 12/31/2014   Iron deficiency anemia 06/13/2014   History of vitamin D deficiency 04/23/2014   Past Medical History:  Diagnosis Date   H/O vitamin D deficiency    Family History  Problem Relation Age of Onset   Hypertension Sister    Cancer Brother        stomach   Thyroid disease Maternal Uncle    Past Surgical History:  Procedure Laterality Date   PELVIC LAPAROSCOPY     tubal ligation   Social History   Occupational History   Not on file  Tobacco Use   Smoking status: Never   Smokeless tobacco: Never  Vaping Use   Vaping Use: Never used  Substance and Sexual Activity   Alcohol use: Yes    Comment: occ   Drug use: No   Sexual activity: Yes    Birth control/protection: Surgical    Comment: TUBAL LIGATION

## 2022-12-22 ENCOUNTER — Other Ambulatory Visit: Payer: Self-pay | Admitting: Physical Medicine and Rehabilitation

## 2022-12-26 ENCOUNTER — Encounter: Payer: Self-pay | Admitting: Physical Medicine and Rehabilitation

## 2023-01-04 ENCOUNTER — Ambulatory Visit: Payer: Commercial Managed Care - PPO | Admitting: Rehabilitative and Restorative Service Providers"

## 2023-01-16 NOTE — Therapy (Signed)
OUTPATIENT PHYSICAL THERAPY EVALUATION   Patient Name: Bethany Sheppard MRN: [id number] DOB:Jun 08, 1971, 52 y.o., female Today's Date: 01/17/2023  END OF SESSION:  PT End of Session - 01/17/23 1507     Visit Number 1    Number of Visits 20    Date for PT Re-Evaluation 03/28/23    Authorization Type [employer] UMR 20%    Progress Note Due on Visit 10    PT Start Time 1516    PT Stop Time 1536    PT Time Calculation (min) 20 min    Activity Tolerance Patient tolerated treatment well    Behavior During Therapy [facility] for tasks assessed/performed             Past Medical History:  Diagnosis Date   H/O vitamin D deficiency    Past Surgical History:  Procedure Laterality Date   PELVIC LAPAROSCOPY     tubal ligation   Patient Active Problem List   Diagnosis Date Noted   Onychomycosis 03/09/2021   Bilateral ovarian cysts 12/31/2014   Iron deficiency anemia 06/13/2014   History of vitamin D deficiency 04/23/2014    PCP: Georgina Quint MD  REFERRING PROVIDER: Juanda Chance, NP  REFERRING DIAG: [phone] (ICD-10-CM) - Chronic right-sided thoracic back pain M54.2,G89.29 (ICD-10-CM) - Neck pain, chronic M79.18 (ICD-10-CM) - Myofascial pain syndrome  THERAPY DIAG:  Cervicalgia - Plan: PT plan of care cert/re-cert  Pain in thoracic spine - Plan: PT plan of care cert/re-cert  Chronic right shoulder pain - Plan: PT plan of care cert/re-cert  Rationale for Evaluation and Treatment: Rehabilitation  ONSET DATE: Several months, approx 11/2022  SUBJECTIVE:                                                                                                                                                                                                         SUBJECTIVE STATEMENT: Chronic right sided thoracic pain, bilateral neck pain, myofascial pain syndrome per referrral.  She indicated doing better overall than when she saw MD visit.  Pt indicated having complaints  on Rt side of neck/shoulder back, noted with cleaning glass door.  She indicated having difficulty sleeping due to symptoms.  Medicine helped with symptoms.   PERTINENT HISTORY:  Unremarkable  PAIN:  NPRS scale: at worst 7/10 Pain location: neck, shoulder, upper back Pain description: achy Aggravating factors: Rt arm use, head turns, lifting laundry Relieving factors: medicine  PRECAUTIONS: None  WEIGHT BEARING RESTRICTIONS: No  FALLS:  Has patient fallen in last 6 months? No  LIVING ENVIRONMENT: Lives with: lives  with their family Lives in: House/apartment   OCCUPATION: no work   PLOF: Independent, Rt hand dominant, cooking/housework  PATIENT GOALS: Feel better.    OBJECTIVE:   DIAGNOSTIC FINDINGS:  No case specific imaging on file in epic  PATIENT SURVEYS:  01/17/2023 FOTO intake:  73  predicted:  88  COGNITION: 01/17/2023 Overall cognitive status: Within functional limits for tasks assessed  SENSATION: 01/17/2023 WFL  POSTURE:  6/4/2024rounded shoulders and forward head  PALPATION: 01/17/2023 Trigger points in bilateral upper trap, infraspinatus, levator .  More noted on Rt.    CERVICAL ROM:   ROM AROM (deg) 01/17/2023  Flexion 60  Extension 60  Right lateral flexion   Left lateral flexion   Right rotation 80 c Rt cervical pain  Left rotation 80c Lt cervical pain    (Blank rows = not tested)  UPPER EXTREMITY ROM:  ROM Right 01/17/2023 Left 01/17/2023  Shoulder flexion    Shoulder extension    Shoulder abduction    Shoulder adduction    Shoulder extension    Shoulder internal rotation    Shoulder external rotation    Elbow flexion    Elbow extension    Wrist flexion    Wrist extension    Wrist ulnar deviation    Wrist radial deviation    Wrist pronation    Wrist supination     (Blank rows = not tested)  UPPER EXTREMITY MMT:  MMT Right 01/17/2023 Left 01/17/2023  Shoulder flexion 5/5 5/5  Shoulder extension    Shoulder abduction 5/5 5/5   Shoulder adduction    Shoulder extension    Shoulder internal rotation 5/5 5/5  Shoulder external rotation 4/5 5/5  Middle trapezius    Lower trapezius    Elbow flexion    Elbow extension    Wrist flexion    Wrist extension    Wrist ulnar deviation    Wrist radial deviation    Wrist pronation    Wrist supination    Grip strength     (Blank rows = not tested)  CERVICAL SPECIAL TESTS:  01/17/2023 No specific testing.  FUNCTIONAL TESTS:  01/17/2023 No specific testing    TODAY'S TREATMENT:                                                                              DATE: 01/17/2023 Therex:    HEP instruction/performance c cues for techniques, handout provided.  Trial set performed of each for comprehension and symptom assessment.  See below for exercise list  PATIENT EDUCATION:  01/17/2023 Education details: HEP, POC Person educated: Patient Education method: Explanation, Demonstration, Verbal cues, and Handouts Education comprehension: verbalized understanding, returned demonstration, and verbal cues required  HOME EXERCISE PROGRAM: Access Code: [email or web address] URL: [email or web address] Date: 01/17/2023 Prepared by: Chyrel Masson  Exercises - Seated Gentle Upper Trapezius Stretch  - 2-3 x daily - 7 x weekly - 1 sets - 5 reps - 15 hold - Shoulder External Rotation and Scapular Retraction  - 2-3 x daily - 7 x weekly - 1 sets - 5 reps - 2 hold - Shoulder External Rotation and Scapular Retraction with Resistance  - 1-2 x daily - 7 x weekly - 1-2 sets -  10-15 reps - Standing Bilateral Low Shoulder Row with Anchored Resistance  - 1-2 x daily - 7 x weekly - 1-2 sets - 10-15 reps - Shoulder Extension with Resistance  - 1-2 x daily - 7 x weekly - 1-2 sets - 10-15 reps  ASSESSMENT:  CLINICAL IMPRESSION: Patient is a 52 y.o. who comes to clinic with complaints of cervical pain, Rt shoulder pain with mobility, strength deficits that impair their ability to perform usual  daily and recreational functional activities without increase difficulty/symptoms at this time.  Patient to benefit from skilled PT services to address impairments and limitations to improve to previous level of function without restriction secondary to condition.    OBJECTIVE IMPAIRMENTS: decreased activity tolerance, decreased endurance, decreased mobility, decreased ROM, decreased strength, hypomobility, increased fascial restrictions, impaired perceived functional ability, impaired flexibility, impaired UE functional use, improper body mechanics, and pain.   ACTIVITY LIMITATIONS: carrying, lifting, and reach over head  PARTICIPATION LIMITATIONS: cleaning, laundry, interpersonal relationship, and community activity  PERSONAL FACTORS:  unremarkable  REHAB POTENTIAL: Good  CLINICAL DECISION MAKING: Stable/uncomplicated  EVALUATION COMPLEXITY: Low   GOALS: Goals reviewed with patient? Yes  SHORT TERM GOALS: (target date for Short term goals are 3 weeks 02/07/2023)  1.Patient will demonstrate independent use of home exercise program to maintain progress from in clinic treatments. Goal status: New  LONG TERM GOALS: (target dates for all long term goals are 10 weeks  03/28/2023 )   1. Patient will demonstrate/report pain at worst less than or equal to 2/10 to facilitate minimal limitation in daily activity secondary to pain symptoms. Goal status: New   2. Patient will demonstrate independent use of home exercise program to facilitate ability to maintain/progress functional gains from skilled physical therapy services. Goal status: New   3. Patient will demonstrate FOTO outcome > or = 88 % to indicate reduced disability due to condition. Goal status: New   4.  Patient will demonstrate cervical AROM WFL s symptoms to facilitate usual head movements for daily activity including driving, self care.   Goal status: New   5.  Patient will demonstrate Rt shoulder MMT 5/5 s symptoms for  usual arm use in household activity s restriction due to symptoms.   Goal status: New     PLAN:  PT FREQUENCY: 1-2x/week  PT DURATION: 10 weeks  PLANNED INTERVENTIONS: Therapeutic exercises, Therapeutic activity, Neuro Muscular re-education, Balance training, Gait training, Patient/Family education, Joint mobilization, Stair training, DME instructions, Dry Needling, Electrical stimulation, Cryotherapy, vasopneumatic device,Traction, Moist heat, Taping, Ultrasound, Ionotophoresis 4mg /ml Dexamethasone, and aquatic therapy, Manual therapy.  All included unless contraindicated  PLAN FOR NEXT SESSION: Check HEP use/response.  Possible trigger point release for upper traps.    Chyrel Masson, PT, DPT, OCS, ATC 01/17/23  3:38 PM

## 2023-01-17 ENCOUNTER — Ambulatory Visit: Payer: Commercial Managed Care - PPO | Admitting: Rehabilitative and Restorative Service Providers"

## 2023-01-17 ENCOUNTER — Encounter: Payer: Self-pay | Admitting: Rehabilitative and Restorative Service Providers"

## 2023-01-17 ENCOUNTER — Other Ambulatory Visit: Payer: Self-pay

## 2023-01-17 DIAGNOSIS — G8929 Other chronic pain: Secondary | ICD-10-CM

## 2023-01-17 DIAGNOSIS — M25511 Pain in right shoulder: Secondary | ICD-10-CM

## 2023-01-17 DIAGNOSIS — M542 Cervicalgia: Secondary | ICD-10-CM

## 2023-01-17 DIAGNOSIS — M546 Pain in thoracic spine: Secondary | ICD-10-CM | POA: Diagnosis not present

## 2023-01-26 ENCOUNTER — Ambulatory Visit: Payer: Commercial Managed Care - PPO | Admitting: Rehabilitative and Restorative Service Providers"

## 2023-01-26 ENCOUNTER — Encounter: Payer: Self-pay | Admitting: Rehabilitative and Restorative Service Providers"

## 2023-01-26 DIAGNOSIS — M542 Cervicalgia: Secondary | ICD-10-CM

## 2023-01-26 DIAGNOSIS — G8929 Other chronic pain: Secondary | ICD-10-CM

## 2023-01-26 DIAGNOSIS — M25511 Pain in right shoulder: Secondary | ICD-10-CM | POA: Diagnosis not present

## 2023-01-26 DIAGNOSIS — M546 Pain in thoracic spine: Secondary | ICD-10-CM

## 2023-01-26 NOTE — Therapy (Signed)
OUTPATIENT PHYSICAL THERAPY TREATMENT   Patient Name: Bethany Sheppard MRN: [id number] DOB:1971-03-17, 52 y.o., female Today's Date: 01/26/2023  END OF SESSION:  PT End of Session - 01/26/23 1554     Visit Number 2    Number of Visits 20    Date for PT Re-Evaluation 03/28/23    Authorization Type [employer] UMR 20%    Progress Note Due on Visit 10    PT Start Time 1554    PT Stop Time 1619    PT Time Calculation (min) 25 min    Activity Tolerance Patient tolerated treatment well    Behavior During Therapy [facility] for tasks assessed/performed              Past Medical History:  Diagnosis Date   H/O vitamin D deficiency    Past Surgical History:  Procedure Laterality Date   PELVIC LAPAROSCOPY     tubal ligation   Patient Active Problem List   Diagnosis Date Noted   Onychomycosis 03/09/2021   Bilateral ovarian cysts 12/31/2014   Iron deficiency anemia 06/13/2014   History of vitamin D deficiency 04/23/2014    PCP: Georgina Quint MD  REFERRING PROVIDER: Juanda Chance, NP  REFERRING DIAG: [phone] (ICD-10-CM) - Chronic right-sided thoracic back pain M54.2,G89.29 (ICD-10-CM) - Neck pain, chronic M79.18 (ICD-10-CM) - Myofascial pain syndrome  THERAPY DIAG:  Cervicalgia  Pain in thoracic spine  Chronic right shoulder pain  Rationale for Evaluation and Treatment: Rehabilitation  ONSET DATE: Several months, approx 11/2022  SUBJECTIVE:                                                                                                                                                                                                         SUBJECTIVE STATEMENT: She indicated having some good and some bad.  She indicated no real pain upon arrival.  4/10 at worst   PERTINENT HISTORY:  Unremarkable  PAIN:  NPRS scale: at worst 4/10 Pain location: neck, shoulder, upper back Pain description: achy Aggravating factors: Rt arm use, head turns, lifting  laundry Relieving factors: medicine  PRECAUTIONS: None  WEIGHT BEARING RESTRICTIONS: No  FALLS:  Has patient fallen in last 6 months? No  LIVING ENVIRONMENT: Lives with: lives with their family Lives in: House/apartment   OCCUPATION: no work   PLOF: Independent, Rt hand dominant, cooking/housework  PATIENT GOALS: Feel better.    OBJECTIVE:   DIAGNOSTIC FINDINGS:  No case specific imaging on file in epic  PATIENT SURVEYS:  01/17/2023 FOTO intake:  73  predicted:  88  COGNITION: 01/17/2023 Overall cognitive status: Within functional limits for tasks assessed  SENSATION: 01/17/2023 WFL  POSTURE:  6/4/2024rounded shoulders and forward head  PALPATION: 01/17/2023 Trigger points in bilateral upper trap, infraspinatus, levator .  More noted on Rt.    CERVICAL ROM:   ROM AROM (deg) 01/17/2023 AROM 01/26/2023  Flexion 60 70  Extension 60 60  Right lateral flexion    Left lateral flexion    Right rotation 80 c Rt cervical pain 80  Left rotation 80c Lt cervical pain  80   (Blank rows = not tested)  UPPER EXTREMITY ROM:  ROM Right 01/17/2023 Left 01/17/2023  Shoulder flexion    Shoulder extension    Shoulder abduction    Shoulder adduction    Shoulder extension    Shoulder internal rotation    Shoulder external rotation    Elbow flexion    Elbow extension    Wrist flexion    Wrist extension    Wrist ulnar deviation    Wrist radial deviation    Wrist pronation    Wrist supination     (Blank rows = not tested)  UPPER EXTREMITY MMT:  MMT Right 01/17/2023 Left 01/17/2023  Shoulder flexion 5/5 5/5  Shoulder extension    Shoulder abduction 5/5 5/5  Shoulder adduction    Shoulder extension    Shoulder internal rotation 5/5 5/5  Shoulder external rotation 4/5 5/5  Middle trapezius    Lower trapezius    Elbow flexion    Elbow extension    Wrist flexion    Wrist extension    Wrist ulnar deviation    Wrist radial deviation    Wrist pronation    Wrist  supination    Grip strength     (Blank rows = not tested)  CERVICAL SPECIAL TESTS:  01/17/2023 No specific testing.  FUNCTIONAL TESTS:  01/17/2023 No specific testing                      TODAY'S TREATMENT:                                                                              DATE: 01/26/2023 Therex:  Chest stretch in corner 15 sec x 3 Supine retraction isometric 5 sec hold x 10 Seated retraction education for use in chair UBE fwd/back UE only lvl 2.0 3 mins each way   Manual:  Supine cervical distraction intermittent.    TODAY'S TREATMENT:                                                                              DATE: 01/17/2023 Therex:    HEP instruction/performance c cues for techniques, handout provided.  Trial set performed of each for comprehension and symptom assessment.  See below for exercise list  PATIENT EDUCATION:  01/17/2023 Education details: HEP, POC Person educated: Patient Education method: Explanation, Demonstration, Verbal cues, and  Handouts Education comprehension: verbalized understanding, returned demonstration, and verbal cues required  HOME EXERCISE PROGRAM: Access Code: [email or web address] URL: [email or web address] Date: 01/26/2023 Prepared by: Chyrel Masson  Exercises - Seated Gentle Upper Trapezius Stretch  - 2-3 x daily - 7 x weekly - 1 sets - 5 reps - 15 hold - Shoulder External Rotation and Scapular Retraction  - 2-3 x daily - 7 x weekly - 1 sets - 5 reps - 2 hold - Shoulder External Rotation and Scapular Retraction with Resistance  - 1-2 x daily - 7 x weekly - 1-2 sets - 10-15 reps - Standing Bilateral Low Shoulder Row with Anchored Resistance  - 1-2 x daily - 7 x weekly - 1-2 sets - 10-15 reps - Shoulder Extension with Resistance  - 1-2 x daily - 7 x weekly - 1-2 sets - 10-15 reps - Corner Pec Major Stretch  - 1-2 x daily - 7 x weekly - 1 sets - 3-5 reps - 15 hold - Cervical Retraction at Wall  - 2 x daily - 7 x weekly - 1 sets -  10 reps - 5 hold - Supine Cervical Retraction with Towel  - 2 x daily - 7 x weekly - 1 sets - 10 reps - 5 hold  ASSESSMENT:  CLINICAL IMPRESSION: Good recall of existing HEP.  Mobility symptom reduction noted in retesting today.  General reporting of reduced symptoms overall.  Pt will be out of town next week.  Plan return in 2 weeks for recheck with possible HEP transitioning pending symptom response.    OBJECTIVE IMPAIRMENTS: decreased activity tolerance, decreased endurance, decreased mobility, decreased ROM, decreased strength, hypomobility, increased fascial restrictions, impaired perceived functional ability, impaired flexibility, impaired UE functional use, improper body mechanics, and pain.   ACTIVITY LIMITATIONS: carrying, lifting, and reach over head  PARTICIPATION LIMITATIONS: cleaning, laundry, interpersonal relationship, and community activity  PERSONAL FACTORS:  unremarkable  REHAB POTENTIAL: Good  CLINICAL DECISION MAKING: Stable/uncomplicated  EVALUATION COMPLEXITY: Low   GOALS: Goals reviewed with patient? Yes  SHORT TERM GOALS: (target date for Short term goals are 3 weeks 02/07/2023)  1.Patient will demonstrate independent use of home exercise program to maintain progress from in clinic treatments. Goal status:  on going 01/26/2023  LONG TERM GOALS: (target dates for all long term goals are 10 weeks  03/28/2023 )   1. Patient will demonstrate/report pain at worst less than or equal to 2/10 to facilitate minimal limitation in daily activity secondary to pain symptoms. Goal status: New   2. Patient will demonstrate independent use of home exercise program to facilitate ability to maintain/progress functional gains from skilled physical therapy services. Goal status: New   3. Patient will demonstrate FOTO outcome > or = 88 % to indicate reduced disability due to condition. Goal status: New   4.  Patient will demonstrate cervical AROM WFL s symptoms to  facilitate usual head movements for daily activity including driving, self care.   Goal status: New   5.  Patient will demonstrate Rt shoulder MMT 5/5 s symptoms for usual arm use in household activity s restriction due to symptoms.   Goal status: New     PLAN:  PT FREQUENCY: 1-2x/week  PT DURATION: 10 weeks  PLANNED INTERVENTIONS: Therapeutic exercises, Therapeutic activity, Neuro Muscular re-education, Balance training, Gait training, Patient/Family education, Joint mobilization, Stair training, DME instructions, Dry Needling, Electrical stimulation, Cryotherapy, vasopneumatic device,Traction, Moist heat, Taping, Ultrasound, Ionotophoresis 4mg /ml Dexamethasone, and aquatic therapy, Manual therapy.  All included unless contraindicated  PLAN FOR NEXT SESSION: FOTO reassessment.    Chyrel Masson, PT, DPT, OCS, ATC 01/26/23  4:17 PM

## 2023-01-31 ENCOUNTER — Encounter: Payer: Commercial Managed Care - PPO | Admitting: Rehabilitative and Restorative Service Providers"

## 2023-02-07 ENCOUNTER — Encounter: Payer: Commercial Managed Care - PPO | Admitting: Rehabilitative and Restorative Service Providers"

## 2023-02-14 ENCOUNTER — Ambulatory Visit: Payer: Commercial Managed Care - PPO | Admitting: Rehabilitative and Restorative Service Providers"

## 2023-02-14 ENCOUNTER — Encounter: Payer: Self-pay | Admitting: Rehabilitative and Restorative Service Providers"

## 2023-02-14 DIAGNOSIS — M546 Pain in thoracic spine: Secondary | ICD-10-CM

## 2023-02-14 DIAGNOSIS — M25511 Pain in right shoulder: Secondary | ICD-10-CM | POA: Diagnosis not present

## 2023-02-14 DIAGNOSIS — G8929 Other chronic pain: Secondary | ICD-10-CM | POA: Diagnosis not present

## 2023-02-14 DIAGNOSIS — M542 Cervicalgia: Secondary | ICD-10-CM | POA: Diagnosis not present

## 2023-02-14 NOTE — Therapy (Signed)
OUTPATIENT PHYSICAL THERAPY TREATMENT   Patient Name: Bethany Sheppard MRN: [id number] DOB:29-Aug-1970, 52 y.o., female Today's Date: 02/14/2023  END OF SESSION:  PT End of Session - 02/14/23 1516     Visit Number 3    Number of Visits 20    Date for PT Re-Evaluation 03/28/23    Authorization Type [employer] UMR 20%    Progress Note Due on Visit 10    PT Start Time 1513    PT Stop Time 1552    PT Time Calculation (min) 39 min    Activity Tolerance Patient tolerated treatment well    Behavior During Therapy [facility] for tasks assessed/performed               Past Medical History:  Diagnosis Date   H/O vitamin D deficiency    Past Surgical History:  Procedure Laterality Date   PELVIC LAPAROSCOPY     tubal ligation   Patient Active Problem List   Diagnosis Date Noted   Onychomycosis 03/09/2021   Bilateral ovarian cysts 12/31/2014   Iron deficiency anemia 06/13/2014   History of vitamin D deficiency 04/23/2014    PCP: Georgina Quint MD  REFERRING PROVIDER: Juanda Chance, NP  REFERRING DIAG: [phone] (ICD-10-CM) - Chronic right-sided thoracic back pain M54.2,G89.29 (ICD-10-CM) - Neck pain, chronic M79.18 (ICD-10-CM) - Myofascial pain syndrome  THERAPY DIAG:  Cervicalgia  Pain in thoracic spine  Chronic right shoulder pain  Rationale for Evaluation and Treatment: Rehabilitation  ONSET DATE: Several months, approx 11/2022  SUBJECTIVE:                                                                                                                                                                                                         SUBJECTIVE STATEMENT: She stated she felt sick last week leading to cancelling visit.  She reported Rt shoulder/arm still has pain.  She indicated complaints with washing dishes, cleaning table with Rt arm.  No pain at rest.   Despite symptoms, she still felt some better than before.   PERTINENT HISTORY:   Unremarkable  PAIN:  NPRS scale: at worst in last few days 4/10 Pain location: neck, shoulder, upper back Pain description: achy Aggravating factors: Rt arm use, head turns, lifting laundry Relieving factors: medicine  PRECAUTIONS: None  WEIGHT BEARING RESTRICTIONS: No  FALLS:  Has patient fallen in last 6 months? No  LIVING ENVIRONMENT: Lives with: lives with their family Lives in: House/apartment   OCCUPATION: no work   PLOF: Independent, Rt hand dominant, cooking/housework  PATIENT GOALS: Feel  better.    OBJECTIVE:   DIAGNOSTIC FINDINGS:  No case specific imaging on file in epic  PATIENT SURVEYS:  02/14/2023: FOTO update:  71  01/17/2023 FOTO intake:  73  predicted:  88  COGNITION: 01/17/2023 Overall cognitive status: Within functional limits for tasks assessed  SENSATION: 01/17/2023 WFL  POSTURE:  6/4/2024rounded shoulders and forward head  PALPATION: 01/17/2023 Trigger points in bilateral upper trap, infraspinatus, levator .  More noted on Rt.    CERVICAL ROM:   ROM AROM (deg) 01/17/2023 AROM 01/26/2023   Flexion 60 70   Extension 60 60   Right lateral flexion     Left lateral flexion     Right rotation 80 c Rt cervical pain 80   Left rotation 80c Lt cervical pain  80    (Blank rows = not tested)  UPPER EXTREMITY ROM:  ROM Right 01/17/2023 Left 01/17/2023  Shoulder flexion    Shoulder extension    Shoulder abduction    Shoulder adduction    Shoulder extension    Shoulder internal rotation    Shoulder external rotation    Elbow flexion    Elbow extension    Wrist flexion    Wrist extension    Wrist ulnar deviation    Wrist radial deviation    Wrist pronation    Wrist supination     (Blank rows = not tested)  UPPER EXTREMITY MMT:  MMT Right 01/17/2023 Left 01/17/2023 Right 02/14/2023  Shoulder flexion 5/5 5/5 5/5  Shoulder extension     Shoulder abduction 5/5 5/5 5/5  Shoulder adduction     Shoulder extension     Shoulder internal  rotation 5/5 5/5 5/5  Shoulder external rotation 4/5 5/5 4+/5 c pain  Middle trapezius     Lower trapezius     Elbow flexion     Elbow extension     Wrist flexion     Wrist extension     Wrist ulnar deviation     Wrist radial deviation     Wrist pronation     Wrist supination     Grip strength      (Blank rows = not tested)  CERVICAL SPECIAL TESTS:  01/17/2023 No specific testing.  FUNCTIONAL TESTS:  01/17/2023 No specific testing                      TODAY'S TREATMENT:                                                                              DATE: 02/14/2023 Manual:  Supine cervical distraction intermittent, Rt infraspinatus compression with movement(AROM ER)  Therex: Standing Green band ER c towel under arm 2 x 10 , performed bilaterally Corner stretch 15 sec x 3  Machine rows 2 x 15 25 lbs  Machine lat pull down 2 x 15 20 lbs UBE fwd/back 3 mins each way UE only lvl 2.5     TODAY'S TREATMENT:  DATE: 01/26/2023 Therex:  Chest stretch in corner 15 sec x 3 Supine retraction isometric 5 sec hold x 10 Seated retraction education for use in chair UBE fwd/back UE only lvl 2.0 3 mins each way   Manual:  Supine cervical distraction intermittent.    TODAY'S TREATMENT:                                                                              DATE: 01/17/2023 Therex:    HEP instruction/performance c cues for techniques, handout provided.  Trial set performed of each for comprehension and symptom assessment.  See below for exercise list  PATIENT EDUCATION:  01/17/2023 Education details: HEP, POC Person educated: Patient Education method: Explanation, Demonstration, Verbal cues, and Handouts Education comprehension: verbalized understanding, returned demonstration, and verbal cues required  HOME EXERCISE PROGRAM: Access Code: [email or web address] URL: [email or web address] Date:  01/26/2023 Prepared by: Chyrel Masson  Exercises - Seated Gentle Upper Trapezius Stretch  - 2-3 x daily - 7 x weekly - 1 sets - 5 reps - 15 hold - Shoulder External Rotation and Scapular Retraction  - 2-3 x daily - 7 x weekly - 1 sets - 5 reps - 2 hold - Shoulder External Rotation and Scapular Retraction with Resistance  - 1-2 x daily - 7 x weekly - 1-2 sets - 10-15 reps - Standing Bilateral Low Shoulder Row with Anchored Resistance  - 1-2 x daily - 7 x weekly - 1-2 sets - 10-15 reps - Shoulder Extension with Resistance  - 1-2 x daily - 7 x weekly - 1-2 sets - 10-15 reps - Corner Pec Major Stretch  - 1-2 x daily - 7 x weekly - 1 sets - 3-5 reps - 15 hold - Cervical Retraction at Wall  - 2 x daily - 7 x weekly - 1 sets - 10 reps - 5 hold - Supine Cervical Retraction with Towel  - 2 x daily - 7 x weekly - 1 sets - 10 reps - 5 hold  ASSESSMENT:  CLINICAL IMPRESSION: FOTO reassessment similar to slightly less compared to eval.  Reports of symptoms in neck/back seem to be improved compared to previous visits.  Rt shoulder region more specific today.  Rt infraspinatus trigger points produced concordant symptoms in Rt shoulder/upper arm.  Treated with manual intervention and progressed for Rt shoulder strengthening in clinic and HEP.    OBJECTIVE IMPAIRMENTS: decreased activity tolerance, decreased endurance, decreased mobility, decreased ROM, decreased strength, hypomobility, increased fascial restrictions, impaired perceived functional ability, impaired flexibility, impaired UE functional use, improper body mechanics, and pain.   ACTIVITY LIMITATIONS: carrying, lifting, and reach over head  PARTICIPATION LIMITATIONS: cleaning, laundry, interpersonal relationship, and community activity  PERSONAL FACTORS:  unremarkable  REHAB POTENTIAL: Good  CLINICAL DECISION MAKING: Stable/uncomplicated  EVALUATION COMPLEXITY: Low   GOALS: Goals reviewed with patient? Yes  SHORT TERM GOALS: (target  date for Short term goals are 3 weeks 02/07/2023)  1.Patient will demonstrate independent use of home exercise program to maintain progress from in clinic treatments. Goal status:  on going 01/26/2023  LONG TERM GOALS: (target dates for all long term goals are 10 weeks  03/28/2023 )   1. Patient will demonstrate/report pain at worst  less than or equal to 2/10 to facilitate minimal limitation in daily activity secondary to pain symptoms. Goal status: New   2. Patient will demonstrate independent use of home exercise program to facilitate ability to maintain/progress functional gains from skilled physical therapy services. Goal status: New   3. Patient will demonstrate FOTO outcome > or = 88 % to indicate reduced disability due to condition. Goal status: New   4.  Patient will demonstrate cervical AROM WFL s symptoms to facilitate usual head movements for daily activity including driving, self care.   Goal status: New   5.  Patient will demonstrate Rt shoulder MMT 5/5 s symptoms for usual arm use in household activity s restriction due to symptoms.   Goal status: New     PLAN:  PT FREQUENCY: 1-2x/week  PT DURATION: 10 weeks  PLANNED INTERVENTIONS: Therapeutic exercises, Therapeutic activity, Neuro Muscular re-education, Balance training, Gait training, Patient/Family education, Joint mobilization, Stair training, DME instructions, Dry Needling, Electrical stimulation, Cryotherapy, vasopneumatic device,Traction, Moist heat, Taping, Ultrasound, Ionotophoresis 4mg /ml Dexamethasone, and aquatic therapy, Manual therapy.  All included unless contraindicated  PLAN FOR NEXT SESSION: Recheck Rt shoulder complaints, progress as tolerated to improve functional use.    Chyrel Masson, PT, DPT, OCS, ATC 02/14/23  3:47 PM

## 2023-02-27 ENCOUNTER — Encounter: Payer: Commercial Managed Care - PPO | Admitting: Rehabilitative and Restorative Service Providers"

## 2023-03-07 ENCOUNTER — Encounter: Payer: Commercial Managed Care - PPO | Admitting: Rehabilitative and Restorative Service Providers"

## 2023-03-14 ENCOUNTER — Ambulatory Visit: Payer: Commercial Managed Care - PPO | Admitting: Rehabilitative and Restorative Service Providers"

## 2023-03-14 ENCOUNTER — Encounter: Payer: Self-pay | Admitting: Rehabilitative and Restorative Service Providers"

## 2023-03-14 DIAGNOSIS — M542 Cervicalgia: Secondary | ICD-10-CM | POA: Diagnosis not present

## 2023-03-14 DIAGNOSIS — M546 Pain in thoracic spine: Secondary | ICD-10-CM | POA: Diagnosis not present

## 2023-03-14 DIAGNOSIS — G8929 Other chronic pain: Secondary | ICD-10-CM | POA: Diagnosis not present

## 2023-03-14 DIAGNOSIS — M25511 Pain in right shoulder: Secondary | ICD-10-CM | POA: Diagnosis not present

## 2023-03-14 NOTE — Therapy (Addendum)
OUTPATIENT PHYSICAL THERAPY TREATMENT  / DISCHARGE   Patient Name: Bethany Sheppard MRN: [id number] DOB:13-Mar-1971, 52 y.o., female Today's Date: 03/14/2023  END OF SESSION:  PT End of Session - 03/14/23 1514     Visit Number 4    Number of Visits 20    Date for PT Re-Evaluation 03/28/23    Authorization Type [employer] UMR 20%    Progress Note Due on Visit 10    PT Start Time 1502    PT Stop Time 1542    PT Time Calculation (min) 40 min    Activity Tolerance Patient tolerated treatment well    Behavior During Therapy [facility] for tasks assessed/performed                Past Medical History:  Diagnosis Date   H/O vitamin D deficiency    Past Surgical History:  Procedure Laterality Date   PELVIC LAPAROSCOPY     tubal ligation   Patient Active Problem List   Diagnosis Date Noted   Onychomycosis 03/09/2021   Bilateral ovarian cysts 12/31/2014   Iron deficiency anemia 06/13/2014   History of vitamin D deficiency 04/23/2014    PCP: Georgina Quint MD  REFERRING PROVIDER: Juanda Chance, NP  REFERRING DIAG: [phone] (ICD-10-CM) - Chronic right-sided thoracic back pain M54.2,G89.29 (ICD-10-CM) - Neck pain, chronic M79.18 (ICD-10-CM) - Myofascial pain syndrome  THERAPY DIAG:  Cervicalgia  Pain in thoracic spine  Chronic right shoulder pain  Rationale for Evaluation and Treatment: Rehabilitation  ONSET DATE: Several months, approx 11/2022  SUBJECTIVE:                                                                                                                                                                                                         SUBJECTIVE STATEMENT: She reported having some complaints at times and noted with increase time with work/repetitive activity. Reported Rt shoulder as chief area when it does hurt.  Reported exercises and movements do help back and shoulder.   PERTINENT HISTORY:  Unremarkable  PAIN:  NPRS scale: at  worst in last few days 4/10 Pain location: neck, shoulder, upper back Pain description: achy Aggravating factors: Rt arm use, head turns, lifting laundry Relieving factors: medicine  PRECAUTIONS: None  WEIGHT BEARING RESTRICTIONS: No  FALLS:  Has patient fallen in last 6 months? No  LIVING ENVIRONMENT: Lives with: lives with their family Lives in: House/apartment   OCCUPATION: no work   PLOF: Independent, Rt hand dominant, cooking/housework  PATIENT GOALS: Feel better.    OBJECTIVE:   DIAGNOSTIC  FINDINGS:  No case specific imaging on file in epic  PATIENT SURVEYS:  02/14/2023: FOTO update:  71  01/17/2023 FOTO intake:  73  predicted:  88  COGNITION: 01/17/2023 Overall cognitive status: Within functional limits for tasks assessed  SENSATION: 01/17/2023 WFL  POSTURE:  6/4/2024rounded shoulders and forward head  PALPATION: 01/17/2023 Trigger points in bilateral upper trap, infraspinatus, levator .  More noted on Rt.    CERVICAL ROM:   ROM AROM (deg) 01/17/2023 AROM 01/26/2023   Flexion 60 70   Extension 60 60   Right lateral flexion     Left lateral flexion     Right rotation 80 c Rt cervical pain 80   Left rotation 80c Lt cervical pain  80    (Blank rows = not tested)  UPPER EXTREMITY ROM:  ROM Right 01/17/2023 Left 01/17/2023  Shoulder flexion    Shoulder extension    Shoulder abduction    Shoulder adduction    Shoulder extension    Shoulder internal rotation    Shoulder external rotation    Elbow flexion    Elbow extension    Wrist flexion    Wrist extension    Wrist ulnar deviation    Wrist radial deviation    Wrist pronation    Wrist supination     (Blank rows = not tested)  UPPER EXTREMITY MMT:  MMT Right 01/17/2023 Left 01/17/2023 Right 02/14/2023 Right 03/14/2023  Shoulder flexion 5/5 5/5 5/5 5/5  Shoulder extension      Shoulder abduction 5/5 5/5 5/5 5/5  Shoulder adduction      Shoulder extension      Shoulder internal rotation 5/5 5/5  5/5 5/5  Shoulder external rotation 4/5 5/5 4+/5 c pain 5/5 c mild pain  Middle trapezius      Lower trapezius      Elbow flexion      Elbow extension      Wrist flexion      Wrist extension      Wrist ulnar deviation      Wrist radial deviation      Wrist pronation      Wrist supination      Grip strength       (Blank rows = not tested)  CERVICAL SPECIAL TESTS:  01/17/2023 No specific testing.  FUNCTIONAL TESTS:  01/17/2023 No specific testing                      TODAY'S TREATMENT:                                                                              DATE: 03/14/2023 Manual:  Supine cervical distraction intermittent, Rt infraspinatus compression with movement(AROM ER)  Therex: Supine cervical retraction isometric into pillow 5 sec hold x 10 Supine horizontal abduction bilateral shoulder green band 2 x 15 Standing green band shoulder ER c towel under arm 2 x 15, performed bilaterally  Standing blue band rows 2 x 15 Standing blue band GH ext 2 x 15   Review of HEP.   TODAY'S TREATMENT:  DATE: 02/14/2023 Manual:  Supine cervical distraction intermittent, Rt infraspinatus compression with movement(AROM ER)  Therex: Standing Green band ER c towel under arm 2 x 10 , performed bilaterally Corner stretch 15 sec x 3  Machine rows 2 x 15 25 lbs  Machine lat pull down 2 x 15 20 lbs UBE fwd/back 3 mins each way UE only lvl 2.5     TODAY'S TREATMENT:                                                                              DATE: 01/26/2023 Therex:  Chest stretch in corner 15 sec x 3 Supine retraction isometric 5 sec hold x 10 Seated retraction education for use in chair UBE fwd/back UE only lvl 2.0 3 mins each way   Manual:  Supine cervical distraction intermittent.    PATIENT EDUCATION:  03/14/2023 Education details: HEP, POC Person educated: Patient Education method: Programmer, multimedia,  Demonstration, Verbal cues, and Handouts Education comprehension: verbalized understanding, returned demonstration, and verbal cues required  HOME EXERCISE PROGRAM: Access Code: [email or web address] URL: [email or web address] Date: 03/14/2023 Prepared by: Chyrel Masson  Exercises - Seated Gentle Upper Trapezius Stretch  - 2-3 x daily - 7 x weekly - 1 sets - 5 reps - 15 hold - Shoulder External Rotation and Scapular Retraction  - 2-3 x daily - 7 x weekly - 1 sets - 5 reps - 2 hold - Corner Pec Major Stretch  - 1-2 x daily - 7 x weekly - 1 sets - 3-5 reps - 15 hold - Cervical Retraction at Wall  - 2 x daily - 7 x weekly - 1 sets - 10 reps - 5 hold - Supine Cervical Retraction with Towel  - 2 x daily - 7 x weekly - 1 sets - 10 reps - 5 hold - Standing Bilateral Low Shoulder Row with Anchored Resistance  - 1-2 x daily - 7 x weekly - 1-2 sets - 10-15 reps - Shoulder Extension with Resistance  - 1-2 x daily - 7 x weekly - 1-2 sets - 10-15 reps - Shoulder External Rotation with Anchored Resistance (Mirrored)  - 1-2 x daily - 7 x weekly - 3 sets - 10-15 reps - Supine Shoulder Horizontal Abduction with Resistance  - 1-2 x daily - 7 x weekly - 3 sets - 10 reps  ASSESSMENT:  CLINICAL IMPRESSION: Pt continued to report some trouble c increased physical activity with things like work with Rt arm for Rt shoulder pain.     OBJECTIVE IMPAIRMENTS: decreased activity tolerance, decreased endurance, decreased mobility, decreased ROM, decreased strength, hypomobility, increased fascial restrictions, impaired perceived functional ability, impaired flexibility, impaired UE functional use, improper body mechanics, and pain.   ACTIVITY LIMITATIONS: carrying, lifting, and reach over head  PARTICIPATION LIMITATIONS: cleaning, laundry, interpersonal relationship, and community activity  PERSONAL FACTORS:  unremarkable  REHAB POTENTIAL: Good  CLINICAL DECISION MAKING: Stable/uncomplicated  EVALUATION  COMPLEXITY: Low   GOALS: Goals reviewed with patient? Yes  SHORT TERM GOALS: (target date for Short term goals are 3 weeks 02/07/2023)  1.Patient will demonstrate independent use of home exercise program to maintain progress from in clinic treatments. Goal status:  on going 01/26/2023  LONG TERM GOALS: (  target dates for all long term goals are 10 weeks  03/28/2023 )   1. Patient will demonstrate/report pain at worst less than or equal to 2/10 to facilitate minimal limitation in daily activity secondary to pain symptoms. Goal status: on going 03/14/2023   2. Patient will demonstrate independent use of home exercise program to facilitate ability to maintain/progress functional gains from skilled physical therapy services. Goal status: on going 03/14/2023   3. Patient will demonstrate FOTO outcome > or = 88 % to indicate reduced disability due to condition. Goal status: on going 03/14/2023   4.  Patient will demonstrate cervical AROM WFL s symptoms to facilitate usual head movements for daily activity including driving, self care.   Goal status: on going 03/14/2023   5.  Patient will demonstrate Rt shoulder MMT 5/5 s symptoms for usual arm use in household activity s restriction due to symptoms.   Goal status: on going 03/14/2023     PLAN:  PT FREQUENCY: 1-2x/week  PT DURATION: 10 weeks  PLANNED INTERVENTIONS: Therapeutic exercises, Therapeutic activity, Neuro Muscular re-education, Balance training, Gait training, Patient/Family education, Joint mobilization, Stair training, DME instructions, Dry Needling, Electrical stimulation, Cryotherapy, vasopneumatic device,Traction, Moist heat, Taping, Ultrasound, Ionotophoresis 4mg /ml Dexamethasone, and aquatic therapy, Manual therapy.  All included unless contraindicated  PLAN FOR NEXT SESSION:   Trigger point release again.  Reassess presentation.    Chyrel Masson, PT, DPT, OCS, ATC 03/14/23  3:41 PM   PHYSICAL THERAPY DISCHARGE  SUMMARY  Visits from Start of Care: 4  Current functional level related to goals / functional outcomes: See note   Remaining deficits: See note   Education / Equipment: HEP  Patient goals were partially met. Patient is being discharged due to not returning since the last visit.  Chyrel Masson, PT, DPT, OCS, ATC 04/18/23  11:24 AM

## 2023-06-12 ENCOUNTER — Ambulatory Visit: Payer: Commercial Managed Care - PPO | Admitting: Emergency Medicine

## 2023-06-12 VITALS — BP 112/88 | HR 83 | Temp 98.2°F | Ht 64.0 in

## 2023-06-12 DIAGNOSIS — E785 Hyperlipidemia, unspecified: Secondary | ICD-10-CM | POA: Insufficient documentation

## 2023-06-12 DIAGNOSIS — H9202 Otalgia, left ear: Secondary | ICD-10-CM | POA: Insufficient documentation

## 2023-06-12 DIAGNOSIS — M542 Cervicalgia: Secondary | ICD-10-CM | POA: Insufficient documentation

## 2023-06-12 DIAGNOSIS — G8929 Other chronic pain: Secondary | ICD-10-CM | POA: Diagnosis not present

## 2023-06-12 LAB — LIPID PANEL
Cholesterol: 204 mg/dL — ABNORMAL HIGH (ref 0–200)
HDL: 63.4 mg/dL (ref >39.00)
LDL Cholesterol: 119 mg/dL — ABNORMAL HIGH (ref 0–99)
NonHDL: 140.35
Total CHOL/HDL Ratio: 3
Triglycerides: 106 mg/dL (ref 0.0–149.0)
VLDL: 21.2 mg/dL (ref 0.0–40.0)

## 2023-06-12 LAB — COMPREHENSIVE METABOLIC PANEL
ALT: 16 U/L (ref 0–35)
AST: 20 U/L (ref 0–37)
Albumin: 4.5 g/dL (ref 3.5–5.2)
Alkaline Phosphatase: 88 U/L (ref 39–117)
BUN: 15 mg/dL (ref 6–23)
CO2: 27 meq/L (ref 19–32)
Calcium: 9.8 mg/dL (ref 8.4–10.5)
Chloride: 104 meq/L (ref 96–112)
Creatinine, Ser: 0.57 mg/dL (ref 0.40–1.20)
GFR: 103.45 mL/min (ref >60.00)
Glucose, Bld: 84 mg/dL (ref 70–99)
Potassium: 3.5 meq/L (ref 3.5–5.1)
Sodium: 141 meq/L (ref 135–145)
Total Bilirubin: 0.2 mg/dL (ref 0.2–1.2)
Total Protein: 7.3 g/dL (ref 6.0–8.3)

## 2023-06-12 LAB — HEMOGLOBIN A1C: Hgb A1c MFr Bld: 5.8 % (ref 4.6–6.5)

## 2023-06-12 MED ORDER — HYDROCORTISONE-ACETIC ACID 1-2 % OT SOLN: 3.0000 [drp] | Freq: Three times a day (TID) | OTIC | 1 refills | Status: AC

## 2023-06-12 NOTE — Assessment & Plan Note (Signed)
Diet and nutrition discussed Lipid profile done today Not on medication The 10-year ASCVD risk score (Arnett DK, et al., 2019) is: 1.4%   Values used to calculate the score:     Age: 52 years     Sex: Female     Is Non-Hispanic African American: No     Diabetic: No     Tobacco smoker: No     Systolic Blood Pressure: 112 mmHg     Is BP treated: No     HDL Cholesterol: 58.4 mg/dL     Total Cholesterol: 228 mg/dL

## 2023-06-12 NOTE — Progress Notes (Signed)
Bethany Sheppard 52 y.o.   Chief Complaint  Patient presents with   Ear Pain    Follow up from past swimmers ear episode. PT notes of using OTC ear drops which did help for the time, but not PT notes of itching and fullness within the ear. PT also would like new assessment of back pain as PT has relief while taking Meloxicam but pain resurfaces as soon as the medication wears off. Notes of fatigue    HISTORY OF PRESENT ILLNESS: This is a 52 y.o. female complaining of chronic neck pain unresponsive to physical therapy for several months. Also complaining of itching and burning to her left ear. No other complaints or medical concerns today Also following up on dyslipidemia  HPI   Prior to Admission medications   Medication Sig Start Date End Date Taking? Authorizing Provider  cyclobenzaprine (FLEXERIL) 10 MG tablet Take 1 tablet (10 mg total) by mouth at bedtime. 08/17/22  Yes Neleh Muldoon, Eilleen Kempf, MD  meloxicam (MOBIC) 15 MG tablet TAKE ONE TABLET BY MOUTH ONCE A DAY Patient not taking: Reported on 06/12/2023 12/22/22   Juanda Chance, NP    No Known Allergies  Patient Active Problem List   Diagnosis Date Noted   Onychomycosis 03/09/2021   Bilateral ovarian cysts 12/31/2014   Iron deficiency anemia 06/13/2014   History of vitamin D deficiency 04/23/2014    Past Medical History:  Diagnosis Date   H/O vitamin D deficiency     Past Surgical History:  Procedure Laterality Date   PELVIC LAPAROSCOPY     tubal ligation    Social History   Socioeconomic History   Marital status: Single    Spouse name: Not on file   Number of children: Not on file   Years of education: Not on file   Highest education level: GED or equivalent  Occupational History   Not on file  Tobacco Use   Smoking status: Never   Smokeless tobacco: Never  Vaping Use   Vaping status: Never Used  Substance and Sexual Activity   Alcohol use: Yes    Comment: occ   Drug use: No   Sexual  activity: Yes    Birth control/protection: Surgical    Comment: TUBAL LIGATION  Other Topics Concern   Not on file  Social History Narrative   Not on file   Social Determinants of Health   Financial Resource Strain: Patient Declined (06/12/2023)   Overall Financial Resource Strain (CARDIA)    Difficulty of Paying Living Expenses: Patient declined  Food Insecurity: Patient Declined (06/12/2023)   Hunger Vital Sign    Worried About Running Out of Food in the Last Year: Patient declined    Ran Out of Food in the Last Year: Patient declined  Transportation Needs: No Transportation Needs (06/12/2023)   PRAPARE - Administrator, Civil Service (Medical): No    Lack of Transportation (Non-Medical): No  Physical Activity: Unknown (06/12/2023)   Exercise Vital Sign    Days of Exercise per Week: 0 days    Minutes of Exercise per Session: Not on file  Stress: No Stress Concern Present (06/12/2023)   [employer] of Occupational Health - Occupational Stress Questionnaire    Feeling of Stress : Only a little  Social Connections: Moderately Integrated (06/12/2023)   Social Connection and Isolation Panel [NHANES]    Frequency of Communication with Friends and Family: Three times a week    Frequency of Social Gatherings with Friends and Family:  Twice a week    Attends Religious Services: 1 to 4 times per year    Active Member of Clubs or Organizations: No    Attends Engineer, structural: Not on file    Marital Status: Living with partner  Intimate Partner Violence: Not on file    Family History  Problem Relation Age of Onset   Hypertension Sister    Cancer Brother        stomach   Thyroid disease Maternal Uncle      Review of Systems  Constitutional: Negative.  Negative for chills and fever.  HENT:  Positive for ear pain.   Respiratory: Negative.  Negative for cough and shortness of breath.   Cardiovascular: Negative.  Negative for chest pain and  palpitations.  Gastrointestinal:  Negative for abdominal pain, diarrhea, nausea and vomiting.  Genitourinary: Negative.  Negative for dysuria and hematuria.  Musculoskeletal:  Positive for neck pain.  Neurological: Negative.  Negative for dizziness and headaches.  All other systems reviewed and are negative.   Vitals:   06/12/23 1406  BP: 112/88  Pulse: 83  Temp: 98.2 F (36.8 C)  SpO2: 97%    Physical Exam Vitals reviewed.  Constitutional:      Appearance: Normal appearance.  HENT:     Head: Normocephalic.     Right Ear: Tympanic membrane, ear canal and external ear normal.     Left Ear: Tympanic membrane normal.     Ears:     Comments: Tender and slightly erythematous left external canal    Mouth/Throat:     Mouth: Mucous membranes are moist.     Pharynx: Oropharynx is clear.  Eyes:     Extraocular Movements: Extraocular movements intact.     Conjunctiva/sclera: Conjunctivae normal.     Pupils: Pupils are equal, round, and reactive to light.  Cardiovascular:     Rate and Rhythm: Normal rate and regular rhythm.     Pulses: Normal pulses.     Heart sounds: Normal heart sounds.  Pulmonary:     Effort: Pulmonary effort is normal.     Breath sounds: Normal breath sounds.  Musculoskeletal:     Cervical back: No tenderness.  Lymphadenopathy:     Cervical: No cervical adenopathy.  Skin:    General: Skin is warm and dry.     Capillary Refill: Capillary refill takes less than 2 seconds.  Neurological:     General: No focal deficit present.     Mental Status: She is alert and oriented to person, place, and time.  Psychiatric:        Mood and Affect: Mood normal.        Behavior: Behavior normal.      ASSESSMENT & PLAN: A total of 44 minutes was spent with the patient and counseling/coordination of care regarding preparing for this visit, review of most recent office visit notes, review of chronic medical conditions under management, review of most recent blood work  results, review of all medications, pain management, need to follow-up with orthopedist regarding chronic neck pain, education on nutrition, prognosis, documentation and need for follow-up.  Problem List Items Addressed This Visit       Nervous and Auditory   Acute otalgia, left    Hyperemic and tender left external canal Recommend to start VoSoL HC eardrops 3 times a day for 1 week Pain management discussed      Relevant Medications   acetic acid-hydrocortisone (VOSOL-HC) OTIC solution     Other  Dyslipidemia - Primary    Diet and nutrition discussed Lipid profile done today Not on medication The 10-year ASCVD risk score (Arnett DK, et al., 2019) is: 1.4%   Values used to calculate the score:     Age: 79 years     Sex: Female     Is Non-Hispanic African American: No     Diabetic: No     Tobacco smoker: No     Systolic Blood Pressure: 112 mmHg     Is BP treated: No     HDL Cholesterol: 58.4 mg/dL     Total Cholesterol: 228 mg/dL       Relevant Orders   Comprehensive metabolic panel   Hemoglobin A1c   Lipid panel   Chronic neck pain    Chronic and affecting quality of life Not responding to physical therapy Recommend to follow-up with orthopedist Recommend cervical spine MRI      Relevant Orders   MR Cervical Spine Wo Contrast   Cervicalgia    Pain management discussed Meloxicam helps May add Tylenol      Relevant Orders   MR Cervical Spine Wo Contrast   Patient Instructions  Mantenimiento de la salud en las mujeres Health Maintenance, Female Adoptar un estilo de vida saludable y recibir atencin preventiva son importantes para promover la salud y Counsellor. Consulte al mdico sobre: El esquema adecuado para hacerse pruebas y exmenes peridicos. Cosas que puede hacer por su cuenta para prevenir enfermedades y [place] sano. Qu debo saber sobre la dieta, el peso y el ejercicio? Consuma una dieta saludable  Consuma una dieta que incluya muchas  verduras, frutas, productos lcteos con bajo contenido de [place] y Associate Professor. No consuma muchos alimentos ricos en grasas slidas, azcares agregados o sodio. Mantenga un peso saludable El ndice de masa muscular [facility]) se [place] para identificar problemas de [place]. Proporciona una estimacin de la grasa corporal basndose en el peso y la altura. Su mdico puede ayudarle a Engineer, site IMC y a Personnel officer o Pharmacologist un peso saludable. Haga ejercicio con regularidad Haga ejercicio con regularidad. Esta es una de las prcticas ms importantes que puede hacer por su salud. La [employer] de los adultos deben seguir estas pautas: Education officer, environmental, al menos, 150 minutos de actividad fsica por semana. El ejercicio debe aumentar la frecuencia cardaca y Media planner transpirar (ejercicio de intensidad moderada). Hacer ejercicios de fortalecimiento por lo [employer] por semana. Agregue esto a su plan de ejercicio de intensidad moderada. Pase menos tiempo sentada. Incluso la actividad fsica ligera puede ser beneficiosa. Controle sus niveles de colesterol y lpidos en la sangre Comience a realizarse anlisis de lpidos y Oncologist en la sangre a los 20 aos y luego reptalos cada 5 aos. Hgase controlar los niveles de colesterol con mayor frecuencia si: Sus niveles de lpidos y colesterol son altos. Es mayor de 40 aos. Presenta un alto riesgo de padecer enfermedades cardacas. Qu debo saber sobre las pruebas de deteccin del cncer? Segn su historia clnica y sus antecedentes familiares, es posible que deba realizarse pruebas de deteccin del cncer en diferentes edades. Esto puede incluir pruebas de deteccin de lo siguiente: Cncer de mama. Cncer de cuello uterino. Cncer colorrectal. Cncer de piel. Cncer de pulmn. Qu debo saber sobre la enfermedad cardaca, la diabetes y la hipertensin arterial? Presin arterial y enfermedad cardaca La hipertensin arterial causa enfermedades cardacas y  [place] el riesgo de accidente cerebrovascular. Es ms probable que esto se manifieste en las personas que tienen lecturas  de presin arterial alta o tienen sobrepeso. Hgase controlar la presin arterial: Cada 3 a 5 aos si tiene entre 18 y 21 aos. Todos los aos si es mayor de 40 aos. Diabetes Realcese exmenes de deteccin de la diabetes con regularidad. Este anlisis revisa el nivel de azcar en la sangre en [place]. Hgase las pruebas de deteccin: Cada tres aos despus de los 40 aos de edad si tiene un peso normal y un bajo riesgo de padecer diabetes. Con ms frecuencia y a partir de [place] edad inferior si tiene sobrepeso o un alto riesgo de padecer diabetes. Qu debo saber sobre la prevencin de infecciones? Hepatitis B Si tiene un riesgo ms alto de contraer hepatitis B, debe someterse a un examen de deteccin de este virus. Hable con el mdico para averiguar si tiene riesgo de contraer la infeccin por hepatitis B. Hepatitis C Se recomienda el anlisis a: [employer] 1945 y 1965. Todas las personas que tengan un riesgo de haber contrado hepatitis C. Enfermedades de transmisin sexual (ETS) Hgase las pruebas de Airline pilot de ITS, incluidas la gonorrea y la clamidia, si: Es sexualmente activa y es menor de [address]. Es mayor de [address], y Public affairs consultant informa que corre riesgo de tener este tipo de infecciones. La actividad sexual ha cambiado desde que le hicieron la ltima prueba de deteccin y tiene un riesgo mayor de Warehouse manager clamidia o Copy. Pregntele al mdico si usted tiene riesgo. Pregntele al mdico si usted tiene un alto riesgo de Primary school teacher VIH. El mdico tambin puede recomendarle un medicamento recetado para ayudar a evitar la infeccin por el VIH. Si elige tomar medicamentos para prevenir el VIH, primero debe [employer] de deteccin del VIH. Luego debe hacerse anlisis cada 3 meses mientras est tomando los medicamentos. Embarazo Si est por  dejar de Armed forces training and education officer (fase premenopusica) y usted puede quedar [place], busque asesoramiento antes de [place]. Tome de 400 a 800 microgramos (mcg) de cido [employer] si [place]. Pida mtodos de control de la natalidad (anticonceptivos) si desea evitar un embarazo no deseado. Osteoporosis y [place] La osteoporosis es una enfermedad en la que los huesos pierden los minerales y la fuerza por el avance de la edad. El resultado pueden ser fracturas en los [place]. Si tiene 65 aos o ms, o si est en riesgo de sufrir osteoporosis y fracturas, pregunte a su mdico si debe: Hacerse pruebas de deteccin de prdida sea. Tomar un suplemento de calcio o de vitamina D para reducir el riesgo de fracturas. Recibir terapia de reemplazo hormonal (TRH) para tratar los sntomas de la menopausia. Siga estas indicaciones en su casa: Consumo de alcohol No beba alcohol si: Su mdico le indica no hacerlo. Est embarazada, puede estar embarazada o est tratando de [place]. Si bebe alcohol: Limite la cantidad que bebe a lo siguiente: De 0 a 1 bebida por da. Sepa cunta cantidad de alcohol hay en las bebidas que toma. En los [address], una medida equivale a una botella de cerveza de 12 oz (355 ml), un vaso de vino de 5 oz (148 ml) o un vaso de una bebida alcohlica de alta graduacin de 1 oz (44 ml). Estilo de vida No consuma ningn producto que contenga nicotina o tabaco. Estos productos incluyen cigarrillos, tabaco para Theatre manager y aparatos de vapeo, como los Administrator, Civil Service. Si necesita ayuda para dejar de consumir estos productos, consulte al mdico. No consuma drogas. No comparta agujas. S[place]ite [employer]  a su mdico si necesita apoyo o informacin para abandonar las drogas. Indicaciones generales Realcese los estudios de rutina de [address], dentales y de Wellsite geologist. Mantngase al da con las vacunas. Infrmele a su mdico si: Se siente deprimida con  frecuencia. Alguna vez ha sido vctima de [place] o no se siente seguro en su casa. Resumen Adoptar un estilo de vida saludable y recibir atencin preventiva son importantes para promover la salud y Counsellor. Siga las instrucciones del mdico acerca de una dieta saludable, el ejercicio y la realizacin de pruebas o exmenes para Hotel manager. Siga las instrucciones del mdico con respecto al control del colesterol y la presin arterial. Esta informacin no tiene Theme park manager el consejo del mdico. Asegrese de hacerle al mdico cualquier pregunta que tenga. Document Revised: 01/07/2021 Document Reviewed: 01/07/2021 Elsevier Patient Education  2024 Elsevier Inc.    Edwina Barth, MD [facility]

## 2023-06-12 NOTE — Assessment & Plan Note (Addendum)
Hyperemic and tender left external canal Recommend to start VoSoL HC eardrops 3 times a day for 1 week Pain management discussed

## 2023-06-12 NOTE — Assessment & Plan Note (Signed)
Pain management discussed Meloxicam helps May add Tylenol

## 2023-06-12 NOTE — Patient Instructions (Signed)

## 2023-06-12 NOTE — Assessment & Plan Note (Addendum)
Chronic and affecting quality of life Not responding to physical therapy Recommend to follow-up with orthopedist Recommend cervical spine MRI

## 2023-06-15 ENCOUNTER — Other Ambulatory Visit: Payer: Self-pay | Admitting: Physical Medicine and Rehabilitation

## 2023-06-15 DIAGNOSIS — G8929 Other chronic pain: Secondary | ICD-10-CM

## 2023-06-15 DIAGNOSIS — M7918 Myalgia, other site: Secondary | ICD-10-CM

## 2023-07-05 ENCOUNTER — Ambulatory Visit
Admission: RE | Admit: 2023-07-05 | Discharge: 2023-07-05 | Disposition: A | Discharge status: Home / Self Care | Payer: Commercial Managed Care - PPO | Source: Ambulatory Visit | Attending: Emergency Medicine

## 2023-07-05 DIAGNOSIS — M542 Cervicalgia: Secondary | ICD-10-CM

## 2023-07-05 DIAGNOSIS — G8929 Other chronic pain: Secondary | ICD-10-CM

## 2023-08-24 ENCOUNTER — Other Ambulatory Visit: Payer: Self-pay | Admitting: Physical Medicine and Rehabilitation

## 2023-08-24 DIAGNOSIS — M7918 Myalgia, other site: Secondary | ICD-10-CM

## 2023-08-24 DIAGNOSIS — G8929 Other chronic pain: Secondary | ICD-10-CM

## 2023-09-04 ENCOUNTER — Ambulatory Visit: Payer: Commercial Managed Care - PPO | Admitting: Physical Therapy

## 2023-09-19 ENCOUNTER — Ambulatory Visit: Payer: Commercial Managed Care - PPO | Admitting: Rehabilitative and Restorative Service Providers"

## 2023-09-19 ENCOUNTER — Encounter: Payer: Self-pay | Admitting: Rehabilitative and Restorative Service Providers"

## 2023-09-19 DIAGNOSIS — M542 Cervicalgia: Secondary | ICD-10-CM | POA: Diagnosis not present

## 2023-09-19 DIAGNOSIS — R293 Abnormal posture: Secondary | ICD-10-CM | POA: Diagnosis not present

## 2023-09-19 DIAGNOSIS — M546 Pain in thoracic spine: Secondary | ICD-10-CM

## 2023-09-19 DIAGNOSIS — M6281 Muscle weakness (generalized): Secondary | ICD-10-CM | POA: Diagnosis not present

## 2023-09-19 NOTE — Therapy (Signed)
 OUTPATIENT PHYSICAL THERAPY EVALUATION   Patient Name: Bethany Sheppard MRN: [id number] DOB:October 26, 1970, 53 y.o., female Today's Date: 09/19/2023  END OF SESSION:  PT End of Session - 09/19/23 1339     Visit Number 1    Number of Visits 20    Date for PT Re-Evaluation 11/28/23    Authorization Type [employer] 20% coinsurance    Progress Note Due on Visit 10    PT Start Time 1341    PT Stop Time 1414    PT Time Calculation (min) 33 min    Activity Tolerance Patient tolerated treatment well    Behavior During Therapy [facility] for tasks assessed/performed             Past Medical History:  Diagnosis Date   H/O vitamin D  deficiency    Past Surgical History:  Procedure Laterality Date   PELVIC LAPAROSCOPY     tubal ligation   Patient Active Problem List   Diagnosis Date Noted   Dyslipidemia 06/12/2023   Acute otalgia, left 06/12/2023   Chronic neck pain 06/12/2023   Cervicalgia 06/12/2023   Onychomycosis 03/09/2021   Bilateral ovarian cysts 12/31/2014   Iron deficiency anemia 06/13/2014   History of vitamin D  deficiency 04/23/2014    PCP: Purcell Emil Schanz, MD  REFERRING PROVIDER: Trudy Duwaine BRAVO, NP  REFERRING DIAG: [phone] (ICD-10-CM) - Chronic right-sided thoracic back pain M54.2,G89.29 (ICD-10-CM) - Neck pain, chronic M79.18 (ICD-10-CM) - Myofascial pain syndrome  Rationale for Evaluation and Treatment: Rehabilitation  THERAPY DIAG:  Cervicalgia  Pain in thoracic spine  Muscle weakness (generalized)  Abnormal posture  ONSET DATE: Ongoing chronic complaints, approx. 11/2022  SUBJECTIVE:                                                                                                                                                                                           SUBJECTIVE STATEMENT: Referral for Chronic right sided thoracic pain, bilateral neck pain, myofascial pain syndrome.  Pt indicated having simlar pains as last time in clinic  in 2024.  Pt indicated complaints from neck bilateral and mid back to Rt side of back.  Pt indicated having symptoms still in Rt arm as well.  Pt indicated intermittent symptom severity.  Pt indicated taking medicine helps.  Pt indicated Rt shoulder symptoms as well.   PERTINENT HISTORY:  Unremarkable per chart.   PAIN:  NPRS scale: current 7/10, at worst 10/10 Pain location: bilateral cervical, mid thoracic, Rt shoulder  Pain description: tightness, soreness, sharp at times Aggravating factors: head movements, lifting, carrying, steady pain, sleeping trouble.  Relieving factors: medicine  PRECAUTIONS: None  WEIGHT BEARING  RESTRICTIONS: No  FALLS:  Has patient fallen in last 6 months? No  LIVING ENVIRONMENT: Lives with: lives with their family Lives in: House/apartment  OCCUPATION: Cooking for hotel  PLOF:  Independent, Rt hand dominant, cooking/housework   PATIENT GOALS: Reduce pain    OBJECTIVE:   IMAGING Chart review:  MRI on 07/05/2023 IMPRESSION: Mild cervical spine degeneration without impingement or inflammation throughout the cervical spine.  PATIENT SURVEYS:  09/19/2023 FOTO eval:  63  predicted:  68  SCREENING FOR RED FLAGS: 09/19/2023 Bowel or bladder incontinence: No Cauda equina syndrome: No  COGNITION: 09/18/2023 Overall cognitive status: WFL normal      SENSATION: 09/19/2023 Not tested  MUSCLE LENGTH: 09/19/2023 Tightness in upper trap bilateral  POSTURE:  09/19/2023 rounded shoulders and increased thoracic kyphosis  PALPATION: 09/19/2023 Trigger points and tenderness bilateral upper trap, levator, infraspinatus  CERVICAL ROM:   Active ROM AROM (deg) 09/19/2023  Flexion 70 c neck pain  Extension 50 neck pain  Right lateral flexion   Left lateral flexion   Right rotation 75 c pain  Left rotation 70 c pain   (Blank rows = not tested)   THORACIC ROM:  09/19/2023  AROM 09/19/2023  Flexion 50 % c pain  Extension 50 % c pain  Right lateral  flexion   Left lateral flexion   Right rotation 75 %  Left rotation 75%   (Blank rows = not tested)   UPPER EXTREMITY MMT:  MMT Right 09/19/2023 Left 09/19/2023  Shoulder flexion 5/5 5/5  Shoulder extension    Shoulder abduction 5/5 5/5  Shoulder adduction    Shoulder extension    Shoulder internal rotation 5/5 5/5  Shoulder external rotation 4/5 5/5  Middle trapezius    Lower trapezius    Elbow flexion    Elbow extension    Wrist flexion    Wrist extension    Wrist ulnar deviation    Wrist radial deviation    Wrist pronation    Wrist supination    Grip strength     (Blank rows = not tested) FUNCTIONAL TESTS:  09/19/2023 No specific testing                                                                                                                                                                                                                   TODAY'S TREATMENT:  DATE: 09/19/2023  Manual Cervical distraction intermittent manually.  Compression with active movement to bilateral upper trap c shoulder shrug in supine.   Therex:    HEP instruction/performance c cues for techniques, handout provided.  Trial set performed of each for comprehension and symptom assessment.  See below for exercise list    PATIENT EDUCATION:  09/19/2023 Education details: HEP, POC Person educated: Patient Education method: Explanation, Demonstration, Verbal cues, and Handouts Education comprehension: verbalized understanding, returned demonstration, and verbal cues required  HOME EXERCISE PROGRAM: Access Code: [email or web address] URL: [email or web address] Date: 09/19/2023 Prepared by: Ozell Silvan  Exercises - Seated Gentle Upper Trapezius Stretch  - 2-3 x daily - 7 x weekly - 1 sets - 5 reps - 15 hold - Shoulder External Rotation and Scapular Retraction  - 2-3 x daily - 7 x weekly  - 1 sets - 5 reps - 2 hold - Corner Pec Major Stretch  - 1-2 x daily - 7 x weekly - 1 sets - 3-5 reps - 15 hold - Cervical Retraction at Wall  - 2 x daily - 7 x weekly - 1 sets - 10 reps - 5 hold - Supine Cervical Retraction with Towel  - 2 x daily - 7 x weekly - 1 sets - 10 reps - 5 hold - Standing Bilateral Low Shoulder Row with Anchored Resistance  - 1-2 x daily - 7 x weekly - 2-3 sets - 10-15 reps - Shoulder Extension with Resistance  - 1-2 x daily - 7 x weekly - 1-2 sets - 10-15 reps  ASSESSMENT:  CLINICAL IMPRESSION: Patient is a 53 y.o. who comes to clinic with complaints of chronic right sided thoracic pain, bilateral neck pain,pain with mobility, strength and movement coordination deficits that impair their ability to perform usual daily and recreational functional activities without increase difficulty/symptoms at this time.  Patient to benefit from skilled PT services to address impairments and limitations to improve to previous level of function without restriction secondary to condition.   OBJECTIVE IMPAIRMENTS: decreased activity tolerance, decreased coordination, decreased endurance, decreased mobility, decreased ROM, decreased strength, hypomobility, increased fascial restrictions, impaired perceived functional ability, increased muscle spasms, impaired flexibility, impaired UE functional use, improper body mechanics, postural dysfunction, and pain.   ACTIVITY LIMITATIONS: carrying, lifting, bending, sitting, standing, sleeping, reach over head, and caring for others  PARTICIPATION LIMITATIONS: meal prep, cleaning, laundry, interpersonal relationship, driving, community activity, and occupation  PERSONAL FACTORS: Past/current experiences, Time since onset of injury/illness/exacerbation, and multiple body parts are also affecting patient's functional outcome.   REHAB POTENTIAL: Good  CLINICAL DECISION MAKING: Evolving/moderate complexity  EVALUATION COMPLEXITY:  Moderate   GOALS: Goals reviewed with patient? Yes  SHORT TERM GOALS: (target date for Short term goals are 3 weeks 10/10/2023)  1. Patient will demonstrate independent use of home exercise program to maintain progress from in clinic treatments.  Goal status: New  LONG TERM GOALS: (target dates for all long term goals are 10 weeks  11/28/2023 )   1. Patient will demonstrate/report pain at worst less than or equal to 2/10 to facilitate minimal limitation in daily activity secondary to pain symptoms.  Goal status: New   2. Patient will demonstrate independent use of home exercise program to facilitate ability to maintain/progress functional gains from skilled physical therapy services.  Goal status: New   3. Patient will demonstrate FOTO outcome > or = 68 % to indicate reduced disability due to condition.  Goal status: New   4. Patient will demonstrate cervical mobility  WFL s symptoms to facilitate upright standing, walking posture at PLOF s limitation.  Goal status: New   5.  Patient will demonstrate thoracic mobility WFL s symptoms to facilitate upright standing, walking posture at PLOF s limitation.  Goal status: New   6.  Patient will demonstrate Rt shoulder ER MMT 5/5 for strength at PLOF to do lifting, carrying, etc of daily work.  Goal status: New    PLAN:  PT FREQUENCY: 1-2x/week  PT DURATION: 10 weeks  PLANNED INTERVENTIONS: Can include 02853- PT Re-evaluation, 97110-Therapeutic exercises, 97530- Therapeutic activity, [id number]- Neuromuscular re-education, 97535- Self Care, 97140- Manual therapy, [phone]- Gait training, [phone]- Orthotic Fit/training, [phone]- Canalith repositioning, [id number]- Aquatic Therapy, 97014- Electrical stimulation (unattended), 97750 Physical performance testing, [id number]- Electrical stimulation (manual), 97016- Vasopneumatic device, [id number]- Ultrasound, [id number]- Traction (mechanical), [id number]- Ionotophoresis 4mg /ml Dexamethasone, Patient/Family education, Balance  training, Stair training, Taping, Dry Needling, Joint mobilization, Joint manipulation, Spinal manipulation, Spinal mobilization, Scar mobilization, Vestibular training, Visual/preceptual remediation/compensation, DME instructions, Cryotherapy, and Moist heat.  All performed as medically necessary.  All included unless contraindicated  PLAN FOR NEXT SESSION: Review HEP knowledge/results.  Myofascial release for upper trap regions.    Ozell Silvan, PT, DPT, OCS, ATC 09/18/23  4:15 PM

## 2023-10-05 ENCOUNTER — Telehealth: Payer: Self-pay | Admitting: Rehabilitative and Restorative Service Providers"

## 2023-10-05 ENCOUNTER — Encounter: Payer: Commercial Managed Care - PPO | Admitting: Rehabilitative and Restorative Service Providers"

## 2023-10-05 NOTE — Telephone Encounter (Signed)
 Called after 15 mins no show for appointment.  Pt indicated she didn't want to drive in the weather but also reported being at work.  Pt indicated she knew the time of her next appointment.  Clinician reminded patient to give [place] a call if she was not able to make it.    Chyrel Masson, PT, DPT, OCS, ATC 10/05/23  1:18 PM

## 2023-10-05 NOTE — Therapy (Deleted)
 OUTPATIENT PHYSICAL THERAPY EVALUATION   Patient Name: Bethany Sheppard MRN: [id number] DOB:11/06/1970, 53 y.o., female Today's Date: 10/05/2023  END OF SESSION:    Past Medical History:  Diagnosis Date   H/O vitamin D deficiency    Past Surgical History:  Procedure Laterality Date   PELVIC LAPAROSCOPY     tubal ligation   Patient Active Problem List   Diagnosis Date Noted   Dyslipidemia 06/12/2023   Acute otalgia, left 06/12/2023   Chronic neck pain 06/12/2023   Cervicalgia 06/12/2023   Onychomycosis 03/09/2021   Bilateral ovarian cysts 12/31/2014   Iron deficiency anemia 06/13/2014   History of vitamin D deficiency 04/23/2014    PCP: Georgina Quint, MD  REFERRING PROVIDER: Juanda Chance, NP  REFERRING DIAG: [phone] (ICD-10-CM) - Chronic right-sided thoracic back pain M54.2,G89.29 (ICD-10-CM) - Neck pain, chronic M79.18 (ICD-10-CM) - Myofascial pain syndrome  Rationale for Evaluation and Treatment: Rehabilitation  THERAPY DIAG:  No diagnosis found.  ONSET DATE: Ongoing chronic complaints, approx. 11/2022  SUBJECTIVE:                                                                                                                                                                                           SUBJECTIVE STATEMENT: Referral for Chronic right sided thoracic pain, bilateral neck pain, myofascial pain syndrome.  Pt indicated having simlar pains as last time in clinic in 2024.  Pt indicated complaints from neck bilateral and mid back to Rt side of back.  Pt indicated having symptoms still in Rt arm as well.  Pt indicated intermittent symptom severity.  Pt indicated taking medicine helps.  Pt indicated Rt shoulder symptoms as well.   PERTINENT HISTORY:  Unremarkable per chart.   PAIN:  NPRS scale: current 7/10, at worst 10/10 Pain location: bilateral cervical, mid thoracic, Rt shoulder  Pain description: tightness, soreness, sharp  at times Aggravating factors: head movements, lifting, carrying, steady pain, sleeping trouble.  Relieving factors: medicine  PRECAUTIONS: None  WEIGHT BEARING RESTRICTIONS: No  FALLS:  Has patient fallen in last 6 months? No  LIVING ENVIRONMENT: Lives with: lives with their family Lives in: House/apartment  OCCUPATION: Cooking for hotel  PLOF:  Independent, Rt hand dominant, cooking/housework   PATIENT GOALS: Reduce pain    OBJECTIVE:   IMAGING Chart review:  MRI on 07/05/2023 IMPRESSION: Mild cervical spine degeneration without impingement or inflammation throughout the cervical spine.  PATIENT SURVEYS:  09/19/2023 FOTO eval:  63  predicted:  68  SCREENING FOR RED FLAGS: 09/19/2023 Bowel or bladder incontinence: No Cauda equina syndrome: No  COGNITION: 09/18/2023 Overall cognitive status: WFL normal  SENSATION: 09/19/2023 Not tested  MUSCLE LENGTH: 09/19/2023 Tightness in upper trap bilateral  POSTURE:  09/19/2023 rounded shoulders and increased thoracic kyphosis  PALPATION: 09/19/2023 Trigger points and tenderness bilateral upper trap, levator, infraspinatus  CERVICAL ROM:   Active ROM AROM (deg) 09/19/2023  Flexion 70 c neck pain  Extension 50 neck pain  Right lateral flexion   Left lateral flexion   Right rotation 75 c pain  Left rotation 70 c pain   (Blank rows = not tested)   THORACIC ROM:  09/19/2023  AROM 09/19/2023  Flexion 50 % c pain  Extension 50 % c pain  Right lateral flexion   Left lateral flexion   Right rotation 75 %  Left rotation 75%   (Blank rows = not tested)   UPPER EXTREMITY MMT:  MMT Right 09/19/2023 Left 09/19/2023  Shoulder flexion 5/5 5/5  Shoulder extension    Shoulder abduction 5/5 5/5  Shoulder adduction    Shoulder extension    Shoulder internal rotation 5/5 5/5  Shoulder external rotation 4/5 5/5  Middle trapezius    Lower trapezius    Elbow flexion    Elbow extension    Wrist flexion    Wrist extension     Wrist ulnar deviation    Wrist radial deviation    Wrist pronation    Wrist supination    Grip strength     (Blank rows = not tested) FUNCTIONAL TESTS:  09/19/2023 No specific testing                                                                                                                                                                                                                   TODAY'S TREATMENT:                                                                                                         DATE: 10/05/2023  Manual Cervical distraction intermittent manually.  Compression with active movement to bilateral upper trap c shoulder shrug in supine.   Therex:    HEP instruction/performance c cues  for techniques, handout provided.  Trial set performed of each for comprehension and symptom assessment.  See below for exercise list    TODAY'S TREATMENT:                                                                                                         DATE: 09/19/2023  Manual Cervical distraction intermittent manually.  Compression with active movement to bilateral upper trap c shoulder shrug in supine.   Therex:    HEP instruction/performance c cues for techniques, handout provided.  Trial set performed of each for comprehension and symptom assessment.  See below for exercise list    PATIENT EDUCATION:  09/19/2023 Education details: HEP, POC Person educated: Patient Education method: Explanation, Demonstration, Verbal cues, and Handouts Education comprehension: verbalized understanding, returned demonstration, and verbal cues required  HOME EXERCISE PROGRAM: Access Code: [email or web address] URL: [email or web address] Date: 09/19/2023 Prepared by: Chyrel Masson  Exercises - Seated Gentle Upper Trapezius Stretch  - 2-3 x daily - 7 x weekly - 1 sets - 5 reps - 15 hold - Shoulder External Rotation and Scapular Retraction  - 2-3 x daily - 7 x weekly - 1 sets -  5 reps - 2 hold - Corner Pec Major Stretch  - 1-2 x daily - 7 x weekly - 1 sets - 3-5 reps - 15 hold - Cervical Retraction at Wall  - 2 x daily - 7 x weekly - 1 sets - 10 reps - 5 hold - Supine Cervical Retraction with Towel  - 2 x daily - 7 x weekly - 1 sets - 10 reps - 5 hold - Standing Bilateral Low Shoulder Row with Anchored Resistance  - 1-2 x daily - 7 x weekly - 2-3 sets - 10-15 reps - Shoulder Extension with Resistance  - 1-2 x daily - 7 x weekly - 1-2 sets - 10-15 reps  ASSESSMENT:  CLINICAL IMPRESSION: Patient is a 53 y.o. who comes to clinic with complaints of chronic right sided thoracic pain, bilateral neck pain,pain with mobility, strength and movement coordination deficits that impair their ability to perform usual daily and recreational functional activities without increase difficulty/symptoms at this time.  Patient to benefit from skilled PT services to address impairments and limitations to improve to previous level of function without restriction secondary to condition.   OBJECTIVE IMPAIRMENTS: decreased activity tolerance, decreased coordination, decreased endurance, decreased mobility, decreased ROM, decreased strength, hypomobility, increased fascial restrictions, impaired perceived functional ability, increased muscle spasms, impaired flexibility, impaired UE functional use, improper body mechanics, postural dysfunction, and pain.   ACTIVITY LIMITATIONS: carrying, lifting, bending, sitting, standing, sleeping, reach over head, and caring for others  PARTICIPATION LIMITATIONS: meal prep, cleaning, laundry, interpersonal relationship, driving, community activity, and occupation  PERSONAL FACTORS: Past/current experiences, Time since onset of injury/illness/exacerbation, and multiple body parts are also affecting patient's functional outcome.   REHAB POTENTIAL: Good  CLINICAL DECISION MAKING: Evolving/moderate complexity  EVALUATION COMPLEXITY: Moderate   GOALS: Goals  reviewed with patient? Yes  SHORT TERM GOALS: (target date for Short term goals are 3 weeks  10/10/2023)  1. Patient will demonstrate independent use of home exercise program to maintain progress from in clinic treatments.  Goal status: New  LONG TERM GOALS: (target dates for all long term goals are 10 weeks  11/28/2023 )   1. Patient will demonstrate/report pain at worst less than or equal to 2/10 to facilitate minimal limitation in daily activity secondary to pain symptoms.  Goal status: New   2. Patient will demonstrate independent use of home exercise program to facilitate ability to maintain/progress functional gains from skilled physical therapy services.  Goal status: New   3. Patient will demonstrate FOTO outcome > or = 68 % to indicate reduced disability due to condition.  Goal status: New   4. Patient will demonstrate cervical mobility WFL s symptoms to facilitate upright standing, walking posture at PLOF s limitation.  Goal status: New   5.  Patient will demonstrate thoracic mobility WFL s symptoms to facilitate upright standing, walking posture at PLOF s limitation.  Goal status: New   6.  Patient will demonstrate Rt shoulder ER MMT 5/5 for strength at PLOF to do lifting, carrying, etc of daily work.  Goal status: New    PLAN:  PT FREQUENCY: 1-2x/week  PT DURATION: 10 weeks  PLANNED INTERVENTIONS: Can include 29562- PT Re-evaluation, 97110-Therapeutic exercises, 97530- Therapeutic activity, [id number]- Neuromuscular re-education, 97535- Self Care, 97140- Manual therapy, [phone]- Gait training, [phone]- Orthotic Fit/training, [phone]- Canalith repositioning, [id number]- Aquatic Therapy, 97014- Electrical stimulation (unattended), 97750 Physical performance testing, [id number]- Electrical stimulation (manual), 97016- Vasopneumatic device, [id number]- Ultrasound, [id number]- Traction (mechanical), [id number]- Ionotophoresis 4mg /ml Dexamethasone, Patient/Family education, Balance training, Stair training,  Taping, Dry Needling, Joint mobilization, Joint manipulation, Spinal manipulation, Spinal mobilization, Scar mobilization, Vestibular training, Visual/preceptual remediation/compensation, DME instructions, Cryotherapy, and Moist heat.  All performed as medically necessary.  All included unless contraindicated  PLAN FOR NEXT SESSION: Review HEP knowledge/results.  Myofascial release for upper trap regions.    Chyrel Masson, PT, DPT, OCS, ATC 09/18/23  4:15 PM

## 2023-10-10 ENCOUNTER — Encounter: Payer: Commercial Managed Care - PPO | Admitting: Rehabilitative and Restorative Service Providers"

## 2023-10-17 ENCOUNTER — Ambulatory Visit: Payer: Commercial Managed Care - PPO | Admitting: Rehabilitative and Restorative Service Providers"

## 2023-10-17 ENCOUNTER — Encounter: Payer: Self-pay | Admitting: Rehabilitative and Restorative Service Providers"

## 2023-10-17 DIAGNOSIS — M542 Cervicalgia: Secondary | ICD-10-CM | POA: Diagnosis not present

## 2023-10-17 DIAGNOSIS — M6281 Muscle weakness (generalized): Secondary | ICD-10-CM | POA: Diagnosis not present

## 2023-10-17 DIAGNOSIS — M546 Pain in thoracic spine: Secondary | ICD-10-CM

## 2023-10-17 DIAGNOSIS — R293 Abnormal posture: Secondary | ICD-10-CM | POA: Diagnosis not present

## 2023-10-17 NOTE — Therapy (Signed)
 OUTPATIENT PHYSICAL THERAPY TREATMENT   Patient Name: Bethany Sheppard MRN: [id number] DOB:September 06, 1970, 53 y.o., female Today's Date: 10/17/2023  END OF SESSION:  PT End of Session - 10/17/23 1500     Visit Number 2    Number of Visits 20    Date for PT Re-Evaluation 11/28/23    Authorization Type [employer] 20% coinsurance    Progress Note Due on Visit 10    PT Start Time 1511    PT Stop Time 1550    PT Time Calculation (min) 39 min    Activity Tolerance Patient tolerated treatment well    Behavior During Therapy [facility] for tasks assessed/performed              Past Medical History:  Diagnosis Date   H/O vitamin D deficiency    Past Surgical History:  Procedure Laterality Date   PELVIC LAPAROSCOPY     tubal ligation   Patient Active Problem List   Diagnosis Date Noted   Dyslipidemia 06/12/2023   Acute otalgia, left 06/12/2023   Chronic neck pain 06/12/2023   Cervicalgia 06/12/2023   Onychomycosis 03/09/2021   Bilateral ovarian cysts 12/31/2014   Iron deficiency anemia 06/13/2014   History of vitamin D deficiency 04/23/2014    PCP: Georgina Quint, MD  REFERRING PROVIDER: Juanda Chance, NP  REFERRING DIAG: [phone] (ICD-10-CM) - Chronic right-sided thoracic back pain M54.2,G89.29 (ICD-10-CM) - Neck pain, chronic M79.18 (ICD-10-CM) - Myofascial pain syndrome  Rationale for Evaluation and Treatment: Rehabilitation  THERAPY DIAG:  Cervicalgia  Pain in thoracic spine  Muscle weakness (generalized)  Abnormal posture  ONSET DATE: Ongoing chronic complaints, approx. 11/2022  SUBJECTIVE:                                                                                                                                                                                           SUBJECTIVE STATEMENT: Pt indicated about the same overall.  Pt indicated morning most noted time of pain.  Pt indicated waking with tightness/pain in neck.  Reported 7/10 or  so this morning. During the day and HEP help improve the symptoms.  Pt indicated short term gains from last visit in clinic.   Pt missed previous visit scheduled due to weather /transportation issue.    PERTINENT HISTORY:  Unremarkable per chart.   PAIN:  NPRS scale: 0/10 arrival, 7/10 at worst Pain location: bilateral cervical, mid thoracic, Rt shoulder  Pain description: tightness, soreness, sharp at times Aggravating factors: head movements, lifting, carrying, steady pain, sleeping trouble.  Relieving factors: medicine  PRECAUTIONS: None  WEIGHT BEARING RESTRICTIONS: No  FALLS:  Has patient fallen  in last 6 months? No  LIVING ENVIRONMENT: Lives with: lives with their family Lives in: House/apartment  OCCUPATION: Cooking for hotel  PLOF:  Independent, Rt hand dominant, cooking/housework   PATIENT GOALS: Reduce pain    OBJECTIVE:   IMAGING Chart review:  MRI on 07/05/2023 IMPRESSION: Mild cervical spine degeneration without impingement or inflammation throughout the cervical spine.  PATIENT SURVEYS:  09/19/2023 FOTO eval:  63  predicted:  68  SCREENING FOR RED FLAGS: 09/19/2023 Bowel or bladder incontinence: No Cauda equina syndrome: No  COGNITION: 09/18/2023 Overall cognitive status: WFL normal      SENSATION: 09/19/2023 Not tested  MUSCLE LENGTH: 09/19/2023 Tightness in upper trap bilateral  POSTURE:  09/19/2023 rounded shoulders and increased thoracic kyphosis  PALPATION: 09/19/2023 Trigger points and tenderness bilateral upper trap, levator, infraspinatus  CERVICAL ROM:   Active ROM AROM (deg) 09/19/2023 AROM 10/17/2023  Flexion 70 c neck pain 70  Extension 50 neck pain 60  Right lateral flexion    Left lateral flexion    Right rotation 75 c pain 80  Left rotation 70 c pain 90   (Blank rows = not tested)   THORACIC ROM:  09/19/2023  AROM 09/19/2023   Flexion 50 % c pain   Extension 50 % c pain   Right lateral flexion    Left lateral flexion     Right rotation 75 %   Left rotation 75%    (Blank rows = not tested)   UPPER EXTREMITY MMT:  MMT Right 09/19/2023 Left 09/19/2023  Shoulder flexion 5/5 5/5  Shoulder extension    Shoulder abduction 5/5 5/5  Shoulder adduction    Shoulder extension    Shoulder internal rotation 5/5 5/5  Shoulder external rotation 4/5 5/5  Middle trapezius    Lower trapezius    Elbow flexion    Elbow extension    Wrist flexion    Wrist extension    Wrist ulnar deviation    Wrist radial deviation    Wrist pronation    Wrist supination    Grip strength     (Blank rows = not tested) FUNCTIONAL TESTS:  09/19/2023 No specific testing                                                                                                                                                                                                                   TODAY'S TREATMENT:  DATE: 10/17/2023  Manual Cervical distraction intermittent manually.  Percussive device to bilateral upper trap. Active compression c movement to bilateral upper trap in supine.   Therex: UBE fwd/back 3 mins each way lvl 2.5 with 1 min rest between directions  Seated upper trap stretch 15 sec x 3 bilateral   Neuro Re-ed (neural recruitment, postural awareness/activation) Scapular retrraction activation 5 sec hold x10 Supine cervical retraction isometric recruitment 5 sec hold x10 Supine scap retraction and bilateral GH extension isometric into table 5 sec hold x 10   TODAY'S TREATMENT:                                                                                                         DATE: 09/19/2023  Manual Cervical distraction intermittent manually.  Compression with active movement to bilateral upper trap c shoulder shrug in supine.   Therex:    HEP instruction/performance c cues for techniques, handout provided.  Trial set  performed of each for comprehension and symptom assessment.  See below for exercise list    PATIENT EDUCATION:  09/19/2023 Education details: HEP, POC Person educated: Patient Education method: Explanation, Demonstration, Verbal cues, and Handouts Education comprehension: verbalized understanding, returned demonstration, and verbal cues required  HOME EXERCISE PROGRAM: Access Code: [email or web address] URL: [email or web address] Date: 09/19/2023 Prepared by: Chyrel Masson  Exercises - Seated Gentle Upper Trapezius Stretch  - 2-3 x daily - 7 x weekly - 1 sets - 5 reps - 15 hold - Shoulder External Rotation and Scapular Retraction  - 2-3 x daily - 7 x weekly - 1 sets - 5 reps - 2 hold - Corner Pec Major Stretch  - 1-2 x daily - 7 x weekly - 1 sets - 3-5 reps - 15 hold - Cervical Retraction at Wall  - 2 x daily - 7 x weekly - 1 sets - 10 reps - 5 hold - Supine Cervical Retraction with Towel  - 2 x daily - 7 x weekly - 1 sets - 10 reps - 5 hold - Standing Bilateral Low Shoulder Row with Anchored Resistance  - 1-2 x daily - 7 x weekly - 2-3 sets - 10-15 reps - Shoulder Extension with Resistance  - 1-2 x daily - 7 x weekly - 1-2 sets - 10-15 reps  ASSESSMENT:  CLINICAL IMPRESSION: Presentation of continued tightness in myofascial areas related to cervical region noted but improved tolerance noted to manual intervention to address tightness complaints.  Reviewed HEP with good overall recall.  Continued complaints of pain noted and impacting daily activity, indicating continued skilled PT indications.   OBJECTIVE IMPAIRMENTS: decreased activity tolerance, decreased coordination, decreased endurance, decreased mobility, decreased ROM, decreased strength, hypomobility, increased fascial restrictions, impaired perceived functional ability, increased muscle spasms, impaired flexibility, impaired UE functional use, improper body mechanics, postural dysfunction, and pain.   ACTIVITY LIMITATIONS:  carrying, lifting, bending, sitting, standing, sleeping, reach over head, and caring for others  PARTICIPATION LIMITATIONS: meal prep, cleaning, laundry, interpersonal relationship, driving, community activity, and occupation  PERSONAL FACTORS: Past/current experiences, Time since onset of injury/illness/exacerbation, and multiple  body parts are also affecting patient's functional outcome.   REHAB POTENTIAL: Good  CLINICAL DECISION MAKING: Evolving/moderate complexity  EVALUATION COMPLEXITY: Moderate   GOALS: Goals reviewed with patient? Yes  SHORT TERM GOALS: (target date for Short term goals are 3 weeks 10/10/2023)  1. Patient will demonstrate independent use of home exercise program to maintain progress from in clinic treatments.  Goal status: New  LONG TERM GOALS: (target dates for all long term goals are 10 weeks  11/28/2023 )   1. Patient will demonstrate/report pain at worst less than or equal to 2/10 to facilitate minimal limitation in daily activity secondary to pain symptoms.  Goal status: New   2. Patient will demonstrate independent use of home exercise program to facilitate ability to maintain/progress functional gains from skilled physical therapy services.  Goal status: New   3. Patient will demonstrate FOTO outcome > or = 68 % to indicate reduced disability due to condition.  Goal status: New   4. Patient will demonstrate cervical mobility WFL s symptoms to facilitate upright standing, walking posture at PLOF s limitation.  Goal status: New   5.  Patient will demonstrate thoracic mobility WFL s symptoms to facilitate upright standing, walking posture at PLOF s limitation.  Goal status: New   6.  Patient will demonstrate Rt shoulder ER MMT 5/5 for strength at PLOF to do lifting, carrying, etc of daily work.  Goal status: New    PLAN:  PT FREQUENCY: 1-2x/week  PT DURATION: 10 weeks  PLANNED INTERVENTIONS: Can include 01027- PT Re-evaluation,  97110-Therapeutic exercises, 97530- Therapeutic activity, [id number]- Neuromuscular re-education, 97535- Self Care, 97140- Manual therapy, [phone]- Gait training, [phone]- Orthotic Fit/training, [phone]- Canalith repositioning, [id number]- Aquatic Therapy, 97014- Electrical stimulation (unattended), 97750 Physical performance testing, [id number]- Electrical stimulation (manual), 97016- Vasopneumatic device, [id number]- Ultrasound, [id number]- Traction (mechanical), [id number]- Ionotophoresis 4mg /ml Dexamethasone, Patient/Family education, Balance training, Stair training, Taping, Dry Needling, Joint mobilization, Joint manipulation, Spinal manipulation, Spinal mobilization, Scar mobilization, Vestibular training, Visual/preceptual remediation/compensation, DME instructions, Cryotherapy, and Moist heat.  All performed as medically necessary.  All included unless contraindicated  PLAN FOR NEXT SESSION: Review HEP knowledge/results.  Myofascial release for upper trap regions.    Chyrel Masson, PT, DPT, OCS, ATC 09/18/23  4:15 PM

## 2023-10-24 ENCOUNTER — Encounter: Payer: Commercial Managed Care - PPO | Admitting: Rehabilitative and Restorative Service Providers"

## 2023-10-31 ENCOUNTER — Encounter: Payer: Commercial Managed Care - PPO | Admitting: Rehabilitative and Restorative Service Providers"

## 2023-11-06 ENCOUNTER — Encounter: Admitting: Physical Therapy

## 2023-11-07 ENCOUNTER — Encounter: Payer: Commercial Managed Care - PPO | Admitting: Rehabilitative and Restorative Service Providers"

## 2023-11-08 ENCOUNTER — Other Ambulatory Visit: Payer: Self-pay | Admitting: Physical Medicine and Rehabilitation

## 2023-11-08 MED ORDER — MELOXICAM 15 MG PO TABS: 15.0000 mg | ORAL_TABLET | Freq: Every day | ORAL | 0 refills | Status: DC

## 2023-11-13 ENCOUNTER — Encounter: Payer: Self-pay | Admitting: Rehabilitative and Restorative Service Providers"

## 2023-11-13 ENCOUNTER — Ambulatory Visit (INDEPENDENT_AMBULATORY_CARE_PROVIDER_SITE_OTHER): Admitting: Rehabilitative and Restorative Service Providers"

## 2023-11-13 DIAGNOSIS — M546 Pain in thoracic spine: Secondary | ICD-10-CM

## 2023-11-13 DIAGNOSIS — M542 Cervicalgia: Secondary | ICD-10-CM | POA: Diagnosis not present

## 2023-11-13 DIAGNOSIS — R293 Abnormal posture: Secondary | ICD-10-CM | POA: Diagnosis not present

## 2023-11-13 DIAGNOSIS — M6281 Muscle weakness (generalized): Secondary | ICD-10-CM | POA: Diagnosis not present

## 2023-11-13 NOTE — Therapy (Addendum)
 OUTPATIENT PHYSICAL THERAPY TREATMENT /DISCHARGE   Patient Name: Bethany Sheppard MRN: [id number] DOB:1970/11/25, 53 y.o., female Today's Date: 11/13/2023  END OF SESSION:  PT End of Session - 11/13/23 1524     Visit Number 3    Number of Visits 20    Date for PT Re-Evaluation 11/28/23    Authorization Type [employer] 20% coinsurance    Progress Note Due on Visit 10    PT Start Time 1520    PT Stop Time 1558    PT Time Calculation (min) 38 min    Activity Tolerance Patient tolerated treatment well    Behavior During Therapy [facility] for tasks assessed/performed               Past Medical History:  Diagnosis Date   H/O vitamin D  deficiency    Past Surgical History:  Procedure Laterality Date   PELVIC LAPAROSCOPY     tubal ligation   Patient Active Problem List   Diagnosis Date Noted   Dyslipidemia 06/12/2023   Acute otalgia, left 06/12/2023   Chronic neck pain 06/12/2023   Cervicalgia 06/12/2023   Onychomycosis 03/09/2021   Bilateral ovarian cysts 12/31/2014   Iron deficiency anemia 06/13/2014   History of vitamin D  deficiency 04/23/2014    PCP: Purcell Emil Schanz, MD  REFERRING PROVIDER: Trudy Duwaine BRAVO, NP  REFERRING DIAG: [phone] (ICD-10-CM) - Chronic right-sided thoracic back pain M54.2,G89.29 (ICD-10-CM) - Neck pain, chronic M79.18 (ICD-10-CM) - Myofascial pain syndrome  Rationale for Evaluation and Treatment: Rehabilitation  THERAPY DIAG:  Cervicalgia  Pain in thoracic spine  Muscle weakness (generalized)  Abnormal posture  ONSET DATE: Ongoing chronic complaints, approx. 11/2022  SUBJECTIVE:                                                                                                                                                                                           SUBJECTIVE STATEMENT: Pt indicated having good and bad days.  Medicine does help and so does the exercises.   PERTINENT HISTORY:  Unremarkable per chart.    PAIN:  NPRS scale:at worst in last few days: 3/10 Pain location: bilateral cervical, mid thoracic, Rt shoulder  Pain description: tightness, soreness, sharp at times Aggravating factors: head movements, lifting, carrying, steady pain, sleeping trouble.  Relieving factors: medicine  PRECAUTIONS: None  WEIGHT BEARING RESTRICTIONS: No  FALLS:  Has patient fallen in last 6 months? No  LIVING ENVIRONMENT: Lives with: lives with their family Lives in: House/apartment  OCCUPATION: Cooking for hotel  PLOF:  Independent, Rt hand dominant, cooking/housework   PATIENT GOALS: Reduce pain    OBJECTIVE:   IMAGING Chart  review:  MRI on 07/05/2023 IMPRESSION: Mild cervical spine degeneration without impingement or inflammation throughout the cervical spine.  PATIENT SURVEYS:  11/13/2023 : FOTO update 66  09/19/2023 FOTO eval:  63  predicted:  68  SCREENING FOR RED FLAGS: 09/19/2023 Bowel or bladder incontinence: No Cauda equina syndrome: No  COGNITION: 09/18/2023 Overall cognitive status: WFL normal      SENSATION: 09/19/2023 Not tested  MUSCLE LENGTH: 09/19/2023 Tightness in upper trap bilateral  POSTURE:  09/19/2023 rounded shoulders and increased thoracic kyphosis  PALPATION: 09/19/2023 Trigger points and tenderness bilateral upper trap, levator, infraspinatus  CERVICAL ROM:   Active ROM AROM (deg) 09/19/2023 AROM 10/17/2023  Flexion 70 c neck pain 70  Extension 50 neck pain 60  Right lateral flexion    Left lateral flexion    Right rotation 75 c pain 80  Left rotation 70 c pain 90   (Blank rows = not tested)   THORACIC ROM:  09/19/2023  AROM 09/19/2023   Flexion 50 % c pain   Extension 50 % c pain   Right lateral flexion    Left lateral flexion    Right rotation 75 %   Left rotation 75%    (Blank rows = not tested)   UPPER EXTREMITY MMT:  MMT Right 09/19/2023 Left 09/19/2023  Shoulder flexion 5/5 5/5  Shoulder extension    Shoulder abduction 5/5 5/5   Shoulder adduction    Shoulder extension    Shoulder internal rotation 5/5 5/5  Shoulder external rotation 4/5 5/5  Middle trapezius    Lower trapezius    Elbow flexion    Elbow extension    Wrist flexion    Wrist extension    Wrist ulnar deviation    Wrist radial deviation    Wrist pronation    Wrist supination    Grip strength     (Blank rows = not tested) FUNCTIONAL TESTS:  09/19/2023 No specific testing                                                                                                                                                                                                                   TODAY'S TREATMENT:  DATE: 10/17/2023  Manual Cervical distraction intermittent manually.  Percussive device to bilateral upper trap.  Therex: UBE fwd/back 4 mins each way lvl 3.0 with 1 min rest between directions  Seated upper trap stretch 15 sec x 3 bilateral  Review of existing HEP for continue use and urging for consistent use.    TODAY'S TREATMENT:                                                                                                         DATE: 09/19/2023  Manual Cervical distraction intermittent manually.  Compression with active movement to bilateral upper trap c shoulder shrug in supine.   Therex:    HEP instruction/performance c cues for techniques, handout provided.  Trial set performed of each for comprehension and symptom assessment.  See below for exercise list    PATIENT EDUCATION:  09/19/2023 Education details: HEP, POC Person educated: Patient Education method: Explanation, Demonstration, Verbal cues, and Handouts Education comprehension: verbalized understanding, returned demonstration, and verbal cues required  HOME EXERCISE PROGRAM: Access Code: [email or web address] URL: [email or web address] Date: 09/19/2023 Prepared by:  Ozell Silvan  Exercises - Seated Gentle Upper Trapezius Stretch  - 2-3 x daily - 7 x weekly - 1 sets - 5 reps - 15 hold - Shoulder External Rotation and Scapular Retraction  - 2-3 x daily - 7 x weekly - 1 sets - 5 reps - 2 hold - Corner Pec Major Stretch  - 1-2 x daily - 7 x weekly - 1 sets - 3-5 reps - 15 hold - Cervical Retraction at Wall  - 2 x daily - 7 x weekly - 1 sets - 10 reps - 5 hold - Supine Cervical Retraction with Towel  - 2 x daily - 7 x weekly - 1 sets - 10 reps - 5 hold - Standing Bilateral Low Shoulder Row with Anchored Resistance  - 1-2 x daily - 7 x weekly - 2-3 sets - 10-15 reps - Shoulder Extension with Resistance  - 1-2 x daily - 7 x weekly - 1-2 sets - 10-15 reps  ASSESSMENT:  CLINICAL IMPRESSION: Pt has reported at times reduced severity of symptoms with combination of medicine and HEP.  Continued encouragement for routine HEP use for symptom relief as well as general postural awareness.  Benefits in manual intervention noted for reduced symptoms while in clinic.   OBJECTIVE IMPAIRMENTS: decreased activity tolerance, decreased coordination, decreased endurance, decreased mobility, decreased ROM, decreased strength, hypomobility, increased fascial restrictions, impaired perceived functional ability, increased muscle spasms, impaired flexibility, impaired UE functional use, improper body mechanics, postural dysfunction, and pain.   ACTIVITY LIMITATIONS: carrying, lifting, bending, sitting, standing, sleeping, reach over head, and caring for others  PARTICIPATION LIMITATIONS: meal prep, cleaning, laundry, interpersonal relationship, driving, community activity, and occupation  PERSONAL FACTORS: Past/current experiences, Time since onset of injury/illness/exacerbation, and multiple body parts are also affecting patient's functional outcome.   REHAB POTENTIAL: Good  CLINICAL DECISION MAKING: Evolving/moderate complexity  EVALUATION COMPLEXITY:  Moderate   GOALS: Goals reviewed with patient? Yes  SHORT TERM GOALS: (target date for  Short term goals are 3 weeks 10/10/2023)  1. Patient will demonstrate independent use of home exercise program to maintain progress from in clinic treatments.  Goal status: previous met.   LONG TERM GOALS: (target dates for all long term goals are 10 weeks  11/28/2023 )   1. Patient will demonstrate/report pain at worst less than or equal to 2/10 to facilitate minimal limitation in daily activity secondary to pain symptoms.  Goal status: on going 11/13/2023   2. Patient will demonstrate independent use of home exercise program to facilitate ability to maintain/progress functional gains from skilled physical therapy services.  Goal status: on going 11/13/2023   3. Patient will demonstrate FOTO outcome > or = 68 % to indicate reduced disability due to condition.  Goal status: on going 11/13/2023   4. Patient will demonstrate cervical mobility WFL s symptoms to facilitate upright standing, walking posture at PLOF s limitation.  Goal status: on going 11/13/2023   5.  Patient will demonstrate thoracic mobility WFL s symptoms to facilitate upright standing, walking posture at PLOF s limitation.  Goal status: on going 11/13/2023   6.  Patient will demonstrate Rt shoulder ER MMT 5/5 for strength at PLOF to do lifting, carrying, etc of daily work.  Goal status: on going 11/13/2023    PLAN:  PT FREQUENCY: 1-2x/week  PT DURATION: 10 weeks  PLANNED INTERVENTIONS: Can include 02853- PT Re-evaluation, 97110-Therapeutic exercises, 97530- Therapeutic activity, 97112- Neuromuscular re-education, 97535- Self Care, 97140- Manual therapy, [phone]- Gait training, [phone]- Orthotic Fit/training, [phone]- Canalith repositioning, [id number]- Aquatic Therapy, 97014- Electrical stimulation (unattended), 97750 Physical performance testing, [id number]- Electrical stimulation (manual), 97016- Vasopneumatic device, [id number]- Ultrasound, [id number]-  Traction (mechanical), [id number]- Ionotophoresis 4mg /ml Dexamethasone, Patient/Family education, Balance training, Stair training, Taping, Dry Needling, Joint mobilization, Joint manipulation, Spinal manipulation, Spinal mobilization, Scar mobilization, Vestibular training, Visual/preceptual remediation/compensation, DME instructions, Cryotherapy, and Moist heat.  All performed as medically necessary.  All included unless contraindicated  PLAN FOR NEXT SESSION:  Recert /LTG date of 11/28/2023   Ozell Silvan, PT, DPT, OCS, ATC 09/18/23  4:15 PM  PHYSICAL THERAPY DISCHARGE SUMMARY  Visits from Start of Care: 3  Current functional level related to goals / functional outcomes: See note   Remaining deficits: See note   Education / Equipment: HEP  Patient goals were partially met. Patient is being discharged due to not returning since the last visit.   Ozell Silvan, PT, DPT, OCS, ATC 02/14/24  9:33 AM

## 2023-12-03 ENCOUNTER — Other Ambulatory Visit: Payer: Self-pay | Admitting: Physical Medicine and Rehabilitation

## 2024-04-16 ENCOUNTER — Encounter: Payer: Self-pay | Admitting: Emergency Medicine

## 2024-04-16 ENCOUNTER — Ambulatory Visit (INDEPENDENT_AMBULATORY_CARE_PROVIDER_SITE_OTHER): Admitting: Emergency Medicine

## 2024-04-16 ENCOUNTER — Ambulatory Visit: Payer: Self-pay | Admitting: Emergency Medicine

## 2024-04-16 VITALS — BP 110/86 | HR 91 | Temp 98.1°F | Ht 64.0 in | Wt 160.0 lb

## 2024-04-16 DIAGNOSIS — Z1211 Encounter for screening for malignant neoplasm of colon: Secondary | ICD-10-CM

## 2024-04-16 DIAGNOSIS — Z1329 Encounter for screening for other suspected endocrine disorder: Secondary | ICD-10-CM | POA: Diagnosis not present

## 2024-04-16 DIAGNOSIS — Z0001 Encounter for general adult medical examination with abnormal findings: Secondary | ICD-10-CM | POA: Diagnosis not present

## 2024-04-16 DIAGNOSIS — L989 Disorder of the skin and subcutaneous tissue, unspecified: Secondary | ICD-10-CM

## 2024-04-16 DIAGNOSIS — G8929 Other chronic pain: Secondary | ICD-10-CM | POA: Diagnosis not present

## 2024-04-16 DIAGNOSIS — M549 Dorsalgia, unspecified: Secondary | ICD-10-CM

## 2024-04-16 DIAGNOSIS — Z23 Encounter for immunization: Secondary | ICD-10-CM

## 2024-04-16 DIAGNOSIS — E785 Hyperlipidemia, unspecified: Secondary | ICD-10-CM

## 2024-04-16 DIAGNOSIS — Z1231 Encounter for screening mammogram for malignant neoplasm of breast: Secondary | ICD-10-CM

## 2024-04-16 DIAGNOSIS — Z13228 Encounter for screening for other metabolic disorders: Secondary | ICD-10-CM

## 2024-04-16 DIAGNOSIS — Z130 Encounter for screening for diseases of the blood and blood-forming organs and certain disorders involving the immune mechanism: Secondary | ICD-10-CM | POA: Diagnosis not present

## 2024-04-16 LAB — COMPREHENSIVE METABOLIC PANEL WITH GFR
ALT: 18 U/L (ref 0–35)
AST: 20 U/L (ref 0–37)
Albumin: 4.6 g/dL (ref 3.5–5.2)
Alkaline Phosphatase: 97 U/L (ref 39–117)
BUN: 19 mg/dL (ref 6–23)
CO2: 27 meq/L (ref 19–32)
Calcium: 9.7 mg/dL (ref 8.4–10.5)
Chloride: 105 meq/L (ref 96–112)
Creatinine, Ser: 0.58 mg/dL (ref 0.40–1.20)
GFR: 102.41 mL/min (ref >60.00)
Glucose, Bld: 105 mg/dL — ABNORMAL HIGH (ref 70–99)
Potassium: 3.9 meq/L (ref 3.5–5.1)
Sodium: 141 meq/L (ref 135–145)
Total Bilirubin: 0.3 mg/dL (ref 0.2–1.2)
Total Protein: 7.7 g/dL (ref 6.0–8.3)

## 2024-04-16 LAB — LIPID PANEL
Cholesterol: 241 mg/dL — ABNORMAL HIGH (ref 0–200)
HDL: 62.1 mg/dL (ref >39.00)
LDL Cholesterol: 155 mg/dL — ABNORMAL HIGH (ref 0–99)
NonHDL: 179.38
Total CHOL/HDL Ratio: 4
Triglycerides: 123 mg/dL (ref 0.0–149.0)
VLDL: 24.6 mg/dL (ref 0.0–40.0)

## 2024-04-16 LAB — CBC WITH DIFFERENTIAL/PLATELET
Basophils Absolute: 0 K/uL (ref 0.0–0.1)
Basophils Relative: 1 % (ref 0.0–3.0)
Eosinophils Absolute: 0.1 K/uL (ref 0.0–0.7)
Eosinophils Relative: 2.4 % (ref 0.0–5.0)
HCT: 41.5 % (ref 36.0–46.0)
Hemoglobin: 13.5 g/dL (ref 12.0–15.0)
Lymphocytes Relative: 38.2 % (ref 12.0–46.0)
Lymphs Abs: 1.6 K/uL (ref 0.7–4.0)
MCHC: 32.4 g/dL (ref 30.0–36.0)
MCV: 88.6 fl (ref 78.0–100.0)
Monocytes Absolute: 0.3 K/uL (ref 0.1–1.0)
Monocytes Relative: 8.2 % (ref 3.0–12.0)
Neutro Abs: 2.1 K/uL (ref 1.4–7.7)
Neutrophils Relative %: 50.2 % (ref 43.0–77.0)
Platelets: 196 K/uL (ref 150.0–400.0)
RBC: 4.69 Mil/uL (ref 3.87–5.11)
RDW: 14.5 % (ref 11.5–15.5)
WBC: 4.1 K/uL (ref 4.0–10.5)

## 2024-04-16 LAB — HEMOGLOBIN A1C: Hgb A1c MFr Bld: 6.1 % (ref 4.6–6.5)

## 2024-04-16 MED ORDER — MELOXICAM 15 MG PO TABS: 15.0000 mg | ORAL_TABLET | Freq: Every day | ORAL | 1 refills | Status: DC | PRN

## 2024-04-16 NOTE — Assessment & Plan Note (Signed)
 Diet and nutrition discussed Lipid profile done today Not on medication The 10-year ASCVD risk score (Arnett DK, et al., 2019) is: 1.2%   Values used to calculate the score:     Age: 53 years     Clincally relevant sex: Female     Is Non-Hispanic African American: No     Diabetic: No     Tobacco smoker: No     Systolic Blood Pressure: 110 mmHg     Is BP treated: No     HDL Cholesterol: 63.4 mg/dL     Total Cholesterol: 204 mg/dL

## 2024-04-16 NOTE — Assessment & Plan Note (Signed)
 Mechanical in nature. Musculoskeletal and related to activities of daily work Advised to take meloxicam  15 mg only when needed May take Tylenol as well Symptom management discussed

## 2024-04-16 NOTE — Patient Instructions (Signed)
 Mantenimiento de Radiographer, therapeutic en las mujeres Health Maintenance, Female Adoptar un estilo de vida saludable y recibir atencin preventiva son importantes para promover la salud y Counsellor. Consulte al mdico sobre: El esquema adecuado para hacerse pruebas y exmenes peridicos. Cosas que puede hacer por su cuenta para prevenir enfermedades y [place] sano. Qu debo saber sobre la dieta, el peso y el ejercicio? Consuma una dieta saludable  Consuma una dieta que incluya muchas verduras, frutas, productos lcteos con bajo contenido de [place] y Associate Professor. No consuma muchos alimentos ricos en grasas slidas, azcares agregados o sodio. Mantenga un peso saludable El ndice de masa muscular [facility]) se [place] para identificar problemas de [place]. Proporciona una estimacin de la grasa corporal basndose en el peso y la altura. Su mdico puede ayudarle a Engineer, site IMC y a Personnel officer o Pharmacologist un peso saludable. Haga ejercicio con regularidad Haga ejercicio con regularidad. Esta es una de las prcticas ms importantes que puede hacer por su salud. La [employer] de los adultos deben seguir estas pautas: Education officer, environmental, al menos, 150 minutos de actividad fsica por semana. El ejercicio debe aumentar la frecuencia cardaca y Media planner transpirar (ejercicio de intensidad moderada). Hacer ejercicios de fortalecimiento por lo [facility] por semana. Agregue esto a su plan de ejercicio de intensidad moderada. Pase menos tiempo sentada. Incluso la actividad fsica ligera puede ser beneficiosa. Controle sus niveles de colesterol y lpidos en la sangre Comience a realizarse anlisis de lpidos y Oncologist en la sangre a los 20 aos y luego reptalos cada 5 aos. Hgase controlar los niveles de colesterol con mayor frecuencia si: Sus niveles de lpidos y colesterol son altos. Es mayor de 40 aos. Presenta un alto riesgo de padecer enfermedades cardacas. Qu debo saber sobre las pruebas de deteccin del  cncer? Segn su historia clnica y sus antecedentes familiares, es posible que deba realizarse pruebas de deteccin del cncer en diferentes edades. Esto puede incluir pruebas de deteccin de lo siguiente: Cncer de mama. Cncer de cuello uterino. Cncer colorrectal. Cncer de piel. Cncer de pulmn. Qu debo saber sobre la enfermedad cardaca, la diabetes y la hipertensin arterial? Presin arterial y enfermedad cardaca La hipertensin arterial causa enfermedades cardacas y [place] el riesgo de accidente cerebrovascular. Es ms probable que esto se manifieste en las personas que tienen lecturas de presin arterial alta o tienen sobrepeso. Hgase controlar la presin arterial: Cada 3 a 5 aos si tiene entre 18 y 50 aos. Todos los aos si es mayor de 40 aos. Diabetes Realcese exmenes de deteccin de la diabetes con regularidad. Este anlisis revisa el nivel de azcar en la sangre en [place]. Hgase las pruebas de deteccin: Cada tres aos despus de los 40 aos de edad si tiene un peso normal y un bajo riesgo de padecer diabetes. Con ms frecuencia y a partir de [place] edad inferior si tiene sobrepeso o un alto riesgo de padecer diabetes. Qu debo saber sobre la prevencin de infecciones? Hepatitis B Si tiene un riesgo ms alto de contraer hepatitis B, debe someterse a un examen de deteccin de este virus. Hable con el mdico para averiguar si tiene riesgo de contraer la infeccin por hepatitis B. Hepatitis C Se recomienda el anlisis a: [employer] 1945 y 1965. Todas las personas que tengan un riesgo de haber contrado hepatitis C. Enfermedades de transmisin sexual (ETS) Hgase las pruebas de Airline pilot de ITS, incluidas la gonorrea y la clamidia, si: Es sexualmente activa y es Adult nurse de 24  aos. Es mayor de 24 aos, y el mdico le informa que corre riesgo de tener este tipo de infecciones. La actividad sexual ha cambiado desde que le hicieron la ltima prueba de  deteccin y tiene un riesgo mayor de Warehouse manager clamidia o Copy. Pregntele al mdico si usted tiene riesgo. Pregntele al mdico si usted tiene un alto riesgo de Primary school teacher VIH. El mdico tambin puede recomendarle un medicamento recetado para ayudar a evitar la infeccin por el VIH. Si elige tomar medicamentos para prevenir el VIH, primero debe [employer] de deteccin del VIH. Luego debe hacerse anlisis cada 3 meses mientras est tomando los medicamentos. Embarazo Si est por dejar de Armed forces training and education officer (fase premenopusica) y usted puede quedar [place], busque asesoramiento antes de [place]. Tome de 400 a 800 microgramos (mcg) de cido [employer] si [place]. Pida mtodos de control de la natalidad (anticonceptivos) si desea evitar un embarazo no deseado. Osteoporosis y [place] La osteoporosis es una enfermedad en la que los huesos pierden los minerales y la fuerza por el avance de la edad. El resultado pueden ser fracturas en los [place]. Si tiene 65 aos o ms, o si est en riesgo de sufrir osteoporosis y fracturas, pregunte a su mdico si debe: Hacerse pruebas de deteccin de prdida sea. Tomar un suplemento de calcio o de vitamina D para reducir el riesgo de fracturas. Recibir terapia de reemplazo hormonal (TRH) para tratar los sntomas de la menopausia. Siga estas indicaciones en su casa: Consumo de alcohol No beba alcohol si: Su mdico le indica no hacerlo. Est embarazada, puede estar embarazada o est tratando de [place]. Si bebe alcohol: Limite la cantidad que bebe a lo siguiente: De 0 a 1 bebida por da. Sepa cunta cantidad de alcohol hay en las bebidas que toma. En los [address], una medida equivale a una botella de cerveza de 12 oz (355 ml), un vaso de vino de 5 oz (148 ml) o un vaso de una bebida alcohlica de alta graduacin de 1 oz (44 ml). Estilo de vida No consuma ningn producto que contenga nicotina o tabaco. Estos  productos incluyen cigarrillos, tabaco para Theatre manager y aparatos de vapeo, como los Administrator, Civil Service. Si necesita ayuda para dejar de consumir estos productos, consulte al mdico. No consuma drogas. No comparta agujas. Solicite ayuda a su mdico si necesita apoyo o informacin para abandonar las drogas. Indicaciones generales Realcese los estudios de rutina de [address], dentales y de Wellsite geologist. Mantngase al da con las vacunas. Infrmele a su mdico si: Se siente deprimida con frecuencia. Alguna vez ha sido vctima de [place] o no se siente seguro en su casa. Resumen Adoptar un estilo de vida saludable y recibir atencin preventiva son importantes para promover la salud y Counsellor. Siga las instrucciones del mdico acerca de una dieta saludable, el ejercicio y la realizacin de pruebas o exmenes para Hotel manager. Siga las instrucciones del mdico con respecto al control del colesterol y la presin arterial. Esta informacin no tiene Theme park manager el consejo del mdico. Asegrese de hacerle al mdico cualquier pregunta que tenga. Document Revised: 01/07/2021 Document Reviewed: 01/07/2021 Elsevier Patient Education  2024 [employer].

## 2024-04-16 NOTE — Assessment & Plan Note (Signed)
 Recommend dermatology evaluation.  Referral placed today.

## 2024-04-16 NOTE — Progress Notes (Signed)
 Bethany Sheppard 53 y.o.   Chief Complaint  Patient presents with   Annual Exam    Patient here for physical. Patient mentions she is having pain that has been going on for about 2 years. The pain does come and go. Also mentions having a dark spot on her right cheek that has been there for a year and wants it looked at     HISTORY OF PRESENT ILLNESS: This is a 53 y.o. female here for annual exam. Occasional back pain.  Chronic for 2 years.  Related to work activities Also concerned about spots on her face.  Needs dermatology referral. No other complaints or medical concerns today.  HPI   Prior to Admission medications   Medication Sig Start Date End Date Taking? Authorizing Provider  acetic acid -hydrocortisone  (VOSOL -HC) OTIC solution Place 3 drops into the left ear 3 (three) times daily. 06/12/23  Yes Bethany Sheppard, Bethany Schanz, MD  cyclobenzaprine  (FLEXERIL ) 10 MG tablet Take 1 tablet (10 mg total) by mouth at bedtime. 08/17/22  Yes Bethany Sheppard, Bethany Schanz, MD  meloxicam  (MOBIC ) 15 MG tablet Take 1 tablet (15 mg total) by mouth daily. 11/08/23  Yes Bethany Duwaine BRAVO, NP    No Known Allergies  Patient Active Problem List   Diagnosis Date Noted   Dyslipidemia 06/12/2023   Chronic neck pain 06/12/2023   Bilateral ovarian cysts 12/31/2014   Iron deficiency anemia 06/13/2014   History of vitamin D  deficiency 04/23/2014    Past Medical History:  Diagnosis Date   H/O vitamin D  deficiency     Past Surgical History:  Procedure Laterality Date   PELVIC LAPAROSCOPY     tubal ligation    Social History   Socioeconomic History   Marital status: Single    Spouse name: Not on file   Number of children: Not on file   Years of education: Not on file   Highest education level: GED or equivalent  Occupational History   Not on file  Tobacco Use   Smoking status: Never   Smokeless tobacco: Never  Vaping Use   Vaping status: Never Used  Substance and Sexual Activity    Alcohol use: Yes    Comment: occ   Drug use: No   Sexual activity: Yes    Birth control/protection: Surgical    Comment: TUBAL LIGATION  Other Topics Concern   Not on file  Social History Narrative   Not on file   Social Drivers of Health   Financial Resource Strain: Low Risk  (04/15/2024)   Overall Financial Resource Strain (CARDIA)    Difficulty of Paying Living Expenses: Not very hard  Food Insecurity: No Food Insecurity (04/15/2024)   Hunger Vital Sign    Worried About Running Out of Food in the Last Year: Never true    Ran Out of Food in the Last Year: Never true  Transportation Needs: No Transportation Needs (04/15/2024)   PRAPARE - Administrator, Civil Service (Medical): No    Lack of Transportation (Non-Medical): No  Physical Activity: Insufficiently Active (04/15/2024)   Exercise Vital Sign    Days of Exercise per Week: 1 day    Minutes of Exercise per Session: 50 min  Stress: No Stress Concern Present (04/15/2024)   [employer] of Occupational Health - Occupational Stress Questionnaire    Feeling of Stress: Only a little  Social Connections: Moderately Isolated (04/15/2024)   Social Connection and Isolation Panel    Frequency of Communication with Friends and Family:  Three times a week    Frequency of Social Gatherings with Friends and Family: Three times a week    Attends Religious Services: Never    Active Member of Clubs or Organizations: No    Attends Engineer, structural: Not on file    Marital Status: Living with partner  Intimate Partner Violence: Not on file    Family History  Problem Relation Age of Onset   Hypertension Sister    Cancer Brother        stomach   Thyroid  disease Maternal Uncle      Review of Systems  Constitutional: Negative.  Negative for chills and fever.  HENT: Negative.  Negative for congestion and sore throat.   Respiratory: Negative.  Negative for cough and shortness of breath.   Cardiovascular: Negative.   Negative for chest pain and palpitations.  Gastrointestinal:  Negative for abdominal pain, diarrhea, nausea and vomiting.  Genitourinary: Negative.  Negative for dysuria and hematuria.  Musculoskeletal:  Positive for back pain.  Skin: Negative.  Negative for rash.       Facial skin lesion  Neurological: Negative.  Negative for dizziness and headaches.  All other systems reviewed and are negative.   Vitals:   04/16/24 0856  BP: 110/86  Pulse: 91  Temp: 98.1 F (36.7 C)  SpO2: 97%    Physical Exam Vitals reviewed.  Constitutional:      Appearance: Normal appearance.  HENT:     Head: Normocephalic.     Right Ear: Tympanic membrane, ear canal and external ear normal.     Left Ear: Tympanic membrane, ear canal and external ear normal.     Mouth/Throat:     Mouth: Mucous membranes are moist.     Pharynx: Oropharynx is clear.  Eyes:     Extraocular Movements: Extraocular movements intact.     Pupils: Pupils are equal, round, and reactive to light.  Cardiovascular:     Rate and Rhythm: Normal rate and regular rhythm.     Pulses: Normal pulses.     Heart sounds: Normal heart sounds.  Pulmonary:     Effort: Pulmonary effort is normal.     Breath sounds: Normal breath sounds.  Abdominal:     Palpations: Abdomen is soft.     Tenderness: There is no abdominal tenderness.  Musculoskeletal:     Cervical back: No tenderness.  Lymphadenopathy:     Cervical: No cervical adenopathy.  Skin:    General: Skin is warm and dry.     Capillary Refill: Capillary refill takes less than 2 seconds.     Findings: Lesion present.  Neurological:     General: No focal deficit present.     Mental Status: She is alert and oriented to person, place, and time.  Psychiatric:        Mood and Affect: Mood normal.        Behavior: Behavior normal.      ASSESSMENT & PLAN: Problem List Items Addressed This Visit       Other   Dyslipidemia   Diet and nutrition discussed Lipid profile done  today Not on medication The 10-year ASCVD risk score (Arnett DK, et al., 2019) is: 1.2%   Values used to calculate the score:     Age: 5 years     Clincally relevant sex: Female     Is Non-Hispanic African American: No     Diabetic: No     Tobacco smoker: No     Systolic  Blood Pressure: 110 mmHg     Is BP treated: No     HDL Cholesterol: 63.4 mg/dL     Total Cholesterol: 204 mg/dL       Relevant Orders   Comprehensive metabolic panel with GFR   Lipid panel   Facial lesion   Recommend dermatology evaluation Referral placed today      Relevant Orders   Ambulatory referral to Dermatology   Chronic bilateral back pain   Mechanical in nature. Musculoskeletal and related to activities of daily work Advised to take meloxicam  15 mg only when needed May take Tylenol as well Symptom management discussed      Relevant Medications   meloxicam  (MOBIC ) 15 MG tablet   Other Visit Diagnoses       Encounter for general adult medical examination with abnormal findings    -  Primary   Relevant Orders   CBC with Differential/Platelet   Comprehensive metabolic panel with GFR   Hemoglobin A1c   Lipid panel     Need for vaccination         Screening for deficiency anemia       Relevant Orders   CBC with Differential/Platelet     Screening for endocrine, metabolic and immunity disorder       Relevant Orders   Comprehensive metabolic panel with GFR   Hemoglobin A1c     Screening for colon cancer       Relevant Orders   Cologuard     Screening mammogram for breast cancer       Relevant Orders   MM Digital Screening        Modifiable risk factors discussed with patient. Anticipatory guidance according to age provided. The following topics were also discussed: Social Determinants of Health Smoking.  Non-smoker Diet and nutrition Benefits of exercise Cancer screening and need for breast and colon cancer screening Vaccinations review and recommendations Cardiovascular risk  assessment The 10-year ASCVD risk score (Arnett DK, et al., 2019) is: 1.2%   Values used to calculate the score:     Age: 89 years     Clincally relevant sex: Female     Is Non-Hispanic African American: No     Diabetic: No     Tobacco smoker: No     Systolic Blood Pressure: 110 mmHg     Is BP treated: No     HDL Cholesterol: 63.4 mg/dL     Total Cholesterol: 204 mg/dL  Mental health including depression and anxiety Fall and accident prevention  Patient Instructions  Mantenimiento de Radiographer, therapeutic en las mujeres Health Maintenance, Female Adoptar un estilo de vida saludable y recibir atencin preventiva son importantes para promover la salud y Counsellor. Consulte al mdico sobre: El esquema adecuado para hacerse pruebas y exmenes peridicos. Cosas que puede hacer por su cuenta para prevenir enfermedades y [place] sano. Qu debo saber sobre la dieta, el peso y el ejercicio? Consuma una dieta saludable  Consuma una dieta que incluya muchas verduras, frutas, productos lcteos con bajo contenido de grasa y protenas magras. No consuma muchos alimentos ricos en grasas slidas, azcares agregados o sodio. Mantenga un peso saludable El ndice de masa muscular [facility]) se [place] para identificar problemas de [place]. Proporciona una estimacin de la grasa corporal basndose en el peso y la altura. Su mdico puede ayudarle a determinar su IMC y a Personnel officer o Pharmacologist un peso saludable. Haga ejercicio con regularidad Haga ejercicio con regularidad. Esta es una de las prcticas ms importantes  que puede hacer por su salud. La [employer] de los adultos deben seguir estas pautas: Education officer, environmental, al menos, 150 minutos de actividad fsica por semana. El ejercicio debe aumentar la frecuencia cardaca y Media planner transpirar (ejercicio de intensidad moderada). Hacer ejercicios de fortalecimiento por lo [facility] por semana. Agregue esto a su plan de ejercicio de intensidad moderada. Pase menos tiempo sentada.  Incluso la actividad fsica ligera puede ser beneficiosa. Controle sus niveles de colesterol y lpidos en la sangre Comience a realizarse anlisis de lpidos y Oncologist en la sangre a los 20 aos y luego reptalos cada 5 aos. Hgase controlar los niveles de colesterol con mayor frecuencia si: Sus niveles de lpidos y colesterol son altos. Es mayor de 40 aos. Presenta un alto riesgo de padecer enfermedades cardacas. Qu debo saber sobre las pruebas de deteccin del cncer? Segn su historia clnica y sus antecedentes familiares, es posible que deba realizarse pruebas de deteccin del cncer en diferentes edades. Esto puede incluir pruebas de deteccin de lo siguiente: Cncer de mama. Cncer de cuello uterino. Cncer colorrectal. Cncer de piel. Cncer de pulmn. Qu debo saber sobre la enfermedad cardaca, la diabetes y la hipertensin arterial? Presin arterial y enfermedad cardaca La hipertensin arterial causa enfermedades cardacas y [place]otho el riesgo de accidente cerebrovascular. Es ms probable que esto se manifieste en las personas que tienen lecturas de presin arterial alta o tienen sobrepeso. Hgase controlar la presin arterial: Cada 3 a 5 aos si tiene entre 18 y 5 aos. Todos los aos si es mayor de 40 aos. Diabetes Realcese exmenes de deteccin de la diabetes con regularidad. Este anlisis revisa el nivel de azcar en la sangre en [place]. Hgase las pruebas de deteccin: Cada tres aos despus de los 40 aos de edad si tiene un peso normal y un bajo riesgo de padecer diabetes. Con ms frecuencia y a partir de [place] edad inferior si tiene sobrepeso o un alto riesgo de padecer diabetes. Qu debo saber sobre la prevencin de infecciones? Hepatitis B Si tiene un riesgo ms alto de contraer hepatitis B, debe someterse a un examen de deteccin de este virus. Hable con el mdico para averiguar si tiene riesgo de contraer la infeccin por hepatitis B. Hepatitis C Se  recomienda el anlisis a: [employer] 1945 y 1965. Todas las personas que tengan un riesgo de haber contrado hepatitis C. Enfermedades de transmisin sexual (ETS) Hgase las pruebas de Airline pilot de ITS, incluidas la gonorrea y la clamidia, si: Es sexualmente activa y es menor de [address]. Es mayor de [address], y Public affairs consultant informa que corre riesgo de tener este tipo de infecciones. La actividad sexual ha cambiado desde que le hicieron la ltima prueba de deteccin y tiene un riesgo mayor de tener clamidia o Copy. Pregntele al mdico si usted tiene riesgo. Pregntele al mdico si usted tiene un alto riesgo de Primary school teacher VIH. El mdico tambin puede recomendarle un medicamento recetado para ayudar a evitar la infeccin por el VIH. Si elige tomar medicamentos para prevenir el VIH, primero debe [employer] de deteccin del VIH. Luego debe hacerse anlisis cada 3 meses mientras est tomando los medicamentos. Embarazo Si est por dejar de menstruar (fase premenopusica) y usted puede quedar embarazada, busque asesoramiento antes de quedar embarazada. Tome de 400 a 800 microgramos (mcg) de cido flico todos los das si queda embarazada. Pida mtodos de control de la natalidad (anticonceptivos) si desea evitar un embarazo no deseado. Osteoporosis y [place]  La osteoporosis es una enfermedad en la que los huesos pierden los minerales y la fuerza por el avance de la edad. El resultado pueden ser fracturas en los [place]. Si tiene 65 aos o ms, o si est en riesgo de sufrir osteoporosis y fracturas, pregunte a su mdico si debe: Hacerse pruebas de deteccin de prdida sea. Tomar un suplemento de calcio o de vitamina D para reducir el riesgo de fracturas. Recibir terapia de reemplazo hormonal (TRH) para tratar los sntomas de la menopausia. Siga estas indicaciones en su casa: Consumo de alcohol No beba alcohol si: Su mdico le indica no hacerlo. Est embarazada, puede  estar embarazada o est tratando de quedar embarazada. Si bebe alcohol: Limite la cantidad que bebe a lo siguiente: De 0 a 1 bebida por da. Sepa cunta cantidad de alcohol hay en las bebidas que toma. En los [address], una medida equivale a una botella de cerveza de 12 oz (355 ml), un vaso de vino de 5 oz (148 ml) o un vaso de una bebida alcohlica de alta graduacin de 1 oz (44 ml). Estilo de vida No consuma ningn producto que contenga nicotina o tabaco. Estos productos incluyen cigarrillos, tabaco para Theatre manager y aparatos de vapeo, como los cigarrillos electrnicos. Si necesita ayuda para dejar de consumir estos productos, consulte al mdico. No consuma drogas. No comparta agujas. Solicite ayuda a su mdico si necesita apoyo o informacin para abandonar las drogas. Indicaciones generales Realcese los estudios de rutina de [address], dentales y de Wellsite geologist. Mantngase al da con las vacunas. Infrmele a su mdico si: Se siente deprimida con frecuencia. Alguna vez ha sido vctima de maltrato o no se siente seguro en su casa. Resumen Adoptar un estilo de vida saludable y recibir atencin preventiva son importantes para promover la salud y Counsellor. Siga las instrucciones del mdico acerca de una dieta saludable, el ejercicio y la realizacin de pruebas o exmenes para Hotel manager. Siga las instrucciones del mdico con respecto al control del colesterol y la presin arterial. Esta informacin no tiene Theme park manager el consejo del mdico. Asegrese de hacerle al mdico cualquier pregunta que tenga. Document Revised: 01/07/2021 Document Reviewed: 01/07/2021 Elsevier Patient Education  2024 Elsevier Inc.      Bethany Schaumann, MD [facility]

## 2024-05-03 LAB — COLOGUARD: COLOGUARD: NEGATIVE

## 2024-06-17 ENCOUNTER — Encounter: Payer: Self-pay | Admitting: Radiology

## 2024-06-20 DIAGNOSIS — R87610 Atypical squamous cells of undetermined significance on cytologic smear of cervix (ASC-US): Secondary | ICD-10-CM | POA: Insufficient documentation

## 2024-07-30 ENCOUNTER — Ambulatory Visit: Payer: Self-pay

## 2024-07-30 NOTE — Telephone Encounter (Signed)
 FYI Only or Action Required?: FYI only for provider: appointment scheduled on 08/01/24.  Patient was last seen in primary care on 04/16/2024 by Purcell Emil Schanz, MD.  Called Nurse Triage reporting Foot Pain and Rash.  Symptoms began several weeks ago.  Interventions attempted: OTC medications: cream  and Rest, hydration, or home remedies.  Symptoms are: gradually worsening.  Triage Disposition: See Physician Within 24 Hours  Patient/caregiver understands and will follow disposition?: Yes, but will wait    Copied from CRM #[id number]. Topic: Clinical - Red Word Triage >> Jul 30, 2024  8:08 AM Alexandria E wrote: [facility] that prompted transfer to Nurse Triage: Pain in left armpit, stated it is red, inflamed, and a little painful. Patient also has pain in her feet. >> Jul 30, 2024  8:16 AM Alexandria E wrote: Patient would prefer a nurse a to call her back. Reason for Disposition  [1] Looks infected (e.g., spreading redness, pus) AND [2] no fever  Answer Assessment - Initial Assessment Questions 1. APPEARANCE of RASH: What does the rash look like? (e.g., blisters, dry flaky skin, red spots, redness, sores)     Red axilla, 1st day draining, no longer draining 2. LOCATION: Where is the rash located?      Left axilla 3. NUMBER: How many spots are there?      One spot 4. SIZE: How big are the spots? (e.g., inches, cm; or compare to size of pinhead, tip of pen, eraser, pea)      Quarter  5. ONSET: When did the rash start?      3 weeks 6. ITCHING: Does the rash itch? If Yes, ask: How bad is the itch?  (Scale 0-10; or none, mild, moderate, severe)     denies 7. PAIN: Does the rash hurt? If Yes, ask: How bad is the pain?  (Scale 0-10; or none, mild, moderate, severe)     Painful to touch  8. OTHER SYMPTOMS: Do you have any other symptoms? (e.g., fever)     Foot pain bilateral works to much.  9. PREGNANCY: Is there any chance you are pregnant? When was your  last menstrual period?  Protocols used: Rash or Redness - Localized-A-AH

## 2024-08-01 ENCOUNTER — Ambulatory Visit: Admitting: Emergency Medicine

## 2024-08-01 ENCOUNTER — Encounter: Payer: Self-pay | Admitting: Emergency Medicine

## 2024-08-01 VITALS — BP 114/80 | HR 78 | Temp 98.1°F | Ht 64.0 in | Wt 160.0 lb

## 2024-08-01 DIAGNOSIS — L729 Follicular cyst of the skin and subcutaneous tissue, unspecified: Secondary | ICD-10-CM | POA: Diagnosis not present

## 2024-08-01 DIAGNOSIS — G8929 Other chronic pain: Secondary | ICD-10-CM

## 2024-08-01 DIAGNOSIS — M549 Dorsalgia, unspecified: Secondary | ICD-10-CM | POA: Diagnosis not present

## 2024-08-01 DIAGNOSIS — L089 Local infection of the skin and subcutaneous tissue, unspecified: Secondary | ICD-10-CM | POA: Insufficient documentation

## 2024-08-01 DIAGNOSIS — M79671 Pain in right foot: Secondary | ICD-10-CM | POA: Diagnosis not present

## 2024-08-01 MED ORDER — MELOXICAM 15 MG PO TABS: 15.0000 mg | ORAL_TABLET | Freq: Every day | ORAL | 1 refills | Status: AC | PRN

## 2024-08-01 MED ORDER — MUPIROCIN 2 % EX OINT: TOPICAL_OINTMENT | CUTANEOUS | 0 refills | Status: DC

## 2024-08-01 MED ORDER — CEFADROXIL 500 MG PO CAPS: 500.0000 mg | ORAL_CAPSULE | Freq: Two times a day (BID) | ORAL | 0 refills | Status: AC

## 2024-08-01 MED ORDER — MUPIROCIN 2 % EX OINT: TOPICAL_OINTMENT | CUTANEOUS | 0 refills | Status: AC

## 2024-08-01 MED ORDER — CEFADROXIL 500 MG PO CAPS: 500.0000 mg | ORAL_CAPSULE | Freq: Two times a day (BID) | ORAL | 0 refills | Status: DC

## 2024-08-01 NOTE — Assessment & Plan Note (Addendum)
 Unremarkable physical exam Recommend x-ray today.  Will review images when available Pain management discussed.  Meloxicam  helps.  Continue as needed. Recommend podiatry evaluation.  Referral placed today.

## 2024-08-01 NOTE — Patient Instructions (Signed)
 Absceso en la piel Skin Abscess  Un absceso en la piel es una zona de la piel infectada. Puede contener pus. Un absceso puede presentarse en cualquier parte del cuerpo. Algunos abscesos se abren (explotan) por s solos. La mayora sigue empeorando a menos que Runner, broadcasting/film/video. Si el absceso no se trata, la infeccin puede extenderse ms profundamente en el cuerpo y la sangre. Esto puede hacer que se sienta mal. Cules son las causas? Grmenes que ingresan en la piel. Esto puede suceder si tiene lo siguiente: Un corte o un rasguo. Una herida producida por una aguja o una picadura de insecto. Obstruccin de las glndulas sebceas o sudorparas. Un problema en el lugar donde el cabello ingresa en la piel. Un saco lleno de lquido llamado quiste debajo de la piel. Qu incrementa el riesgo? Tener problemas con el movimiento de la sangre por el cuerpo. Tener debilitado el sistema de defensa del cuerpo (sistema inmunitario). Tener diabetes. Tener la piel seca e irritada. Necesitar aplicarse vacunas con frecuencia. Inyectarse drogas en el cuerpo con ignacia gambler. Tener una astilla o alguna otra cosa en la piel. Fumar. Cules son los signos o sntomas? Una protuberancia firme debajo de la piel que le duele. Una protuberancia con pus en la parte superior. Enrojecimiento e hinchazn. Zonas calientes o sensibles. Una lcera en la piel. Cmo se trata? Es posible que deba hacer lo siguiente: Scientific laboratory technician una compresa trmica o un pao tibio y Chief Executive Officer. Hacer que le drenen el pus. Tomar antibiticos. Siga estas indicaciones en su casa: Medicamentos Use los medicamentos de venta libre y los recetados solamente como se lo haya indicado el mdico. Si le recetaron antibiticos, tmelos como se lo haya indicado el mdico. No deje de tomarlos aunque comience a sentirse mejor. Cuidado del absceso  Si tiene un absceso que no ha drenado, [employer] l. Use la fuente de calor que el  mdico le recomiende, como una compresa de calor hmedo o una almohadilla trmica. Coloque una toalla entre la piel y la fuente de calor. Aplique calor durante 20 a 30 minutos. Si la piel se le pone de color rojo brillante, retire Company secretary de inmediato para evitar quemaduras. El riesgo de quemaduras es mayor si no puede sentir el dolor, el calor o el fro. Siga las instrucciones el mdico en lo que respecta al cuidado del absceso. Asegrese de hacer lo siguiente: Cubra el absceso con un vendaje. Lvese las manos con agua y jabn durante al menos 20 segundos antes y despus de cambiarse la venda. Use un desinfectante para manos si no dispone de agua y jabn. Cambie las vendas como se lo haya indicado el mdico. Controle el absceso todos los das para observar si hay signos de que la infeccin [place]. Preste atencin a los siguientes signos: Aumento del enrojecimiento, la hinchazn o Chief Technology Officer. Ms lquido malva bertrand. Calor. Ms pus o peor olor. Indicaciones generales Para evitar que la infeccin se propague: No comparta objetos personales o toallas. No se bae en un jacuzzi con [employer]. Evite el contacto piel con piel con otras personas. Tenga cuidado al [employer] vendajes usados o el pus del absceso. No fume ni consuma ningn producto que contenga nicotina o tabaco. Si necesita ayuda para dejar de consumir estos productos, consulte al mdico. Comunquese con un mdico si: Observa lneas rojas en la piel cerca del absceso. Tiene signos de que una infeccin empeora. Vomita cada vez que come o bebe. Tiene  fiebre, escalofros o dolores musculares. El quiste o absceso reaparece. Solicite ayuda de inmediato si: Nurse, adult. Hace menos pis (orina) de lo normal. Esta informacin no tiene como fin reemplazar el consejo del mdico. Asegrese de hacerle al mdico cualquier pregunta que tenga. Document Revised: 04/19/2022 Document Reviewed: 04/19/2022 Elsevier Patient Education   2024 [employer].

## 2024-08-01 NOTE — Assessment & Plan Note (Signed)
 Recommend to start cefadroxil  500 mg twice a day Bactroban  topically twice a day Advised to contact the office if no better or worse during the next several days Warm compresses several times during the day

## 2024-08-01 NOTE — Progress Notes (Signed)
 Bethany Sheppard 53 y.o.   Chief Complaint  Patient presents with   Pain    Pt states that both her heels are very painful and there hard when touching   Rash    Pt has a rash/bump under her left armpit which is painful     HISTORY OF PRESENT ILLNESS: This is a 53 y.o. female complaining of the things today. 1.  Painful lump to left axillary area 2.  Painful right heel No other complaints or medical concerns   Rash Pertinent negatives include no congestion, cough, fever, shortness of breath, sore throat or vomiting.     Prior to Admission medications  Medication Sig Start Date End Date Taking? Authorizing Provider  acetic acid -hydrocortisone  (VOSOL -HC) OTIC solution Place 3 drops into the left ear 3 (three) times daily. 06/12/23  Yes Berton Butrick, Emil Schanz, MD  cyclobenzaprine  (FLEXERIL ) 10 MG tablet Take 1 tablet (10 mg total) by mouth at bedtime. 08/17/22  Yes Makella Buckingham, Emil Schanz, MD  meloxicam  (MOBIC ) 15 MG tablet Take 1 tablet (15 mg total) by mouth daily as needed for pain. 04/16/24  Yes Purcell Emil Schanz, MD    Allergies[1]  Patient Active Problem List   Diagnosis Date Noted   Facial lesion 04/16/2024   Chronic bilateral back pain 04/16/2024   Dyslipidemia 06/12/2023   Chronic neck pain 06/12/2023   Bilateral ovarian cysts 12/31/2014   Iron deficiency anemia 06/13/2014   History of vitamin D  deficiency 04/23/2014    Past Medical History:  Diagnosis Date   H/O vitamin D  deficiency     Past Surgical History:  Procedure Laterality Date   PELVIC LAPAROSCOPY     tubal ligation    Social History   Socioeconomic History   Marital status: Single    Spouse name: Not on file   Number of children: Not on file   Years of education: Not on file   Highest education level: GED or equivalent  Occupational History   Not on file  Tobacco Use   Smoking status: Never   Smokeless tobacco: Never  Vaping Use   Vaping status: Never Used  Substance and  Sexual Activity   Alcohol use: Yes    Comment: occ   Drug use: No   Sexual activity: Yes    Birth control/protection: Surgical    Comment: TUBAL LIGATION  Other Topics Concern   Not on file  Social History Narrative   Not on file   Social Drivers of Health   Tobacco Use: Low Risk (08/01/2024)   Patient History    Smoking Tobacco Use: Never    Smokeless Tobacco Use: Never    Passive Exposure: Not on file  Financial Resource Strain: Low Risk (04/15/2024)   Overall Financial Resource Strain (CARDIA)    Difficulty of Paying Living Expenses: Not very hard  Food Insecurity: No Food Insecurity (04/15/2024)   Epic    Worried About Radiation Protection Practitioner of Food in the Last Year: Never true    [employer] of Food in the Last Year: Never true  Transportation Needs: No Transportation Needs (04/15/2024)   Epic    Lack of Transportation (Medical): No    Lack of Transportation (Non-Medical): No  Physical Activity: Insufficiently Active (04/15/2024)   Exercise Vital Sign    Days of Exercise per Week: 1 day    Minutes of Exercise per Session: 50 min  Stress: No Stress Concern Present (04/15/2024)   [employer] of Occupational Health - Occupational Stress Questionnaire  Feeling of Stress: Only a little  Social Connections: Moderately Isolated (04/15/2024)   Social Connection and Isolation Panel    Frequency of Communication with Friends and Family: Three times a week    Frequency of Social Gatherings with Friends and Family: Three times a week    Attends Religious Services: Never    Active Member of Clubs or Organizations: No    Attends Banker Meetings: Not on file    Marital Status: Living with partner  Intimate Partner Violence: Not on file  Depression (PHQ2-9): Low Risk (04/16/2024)   Depression (PHQ2-9)    PHQ-2 Score: 0  Alcohol Screen: Low Risk (04/15/2024)   Alcohol Screen    Last Alcohol Screening Score (AUDIT): 3  Housing: Unknown (04/15/2024)   Epic    Unable to Pay for Housing in  the Last Year: No    Number of Times Moved in the Last Year: Not on file    Homeless in the Last Year: No  Utilities: Not on file  Health Literacy: Not on file    Family History  Problem Relation Age of Onset   Hypertension Sister    Cancer Brother        stomach   Thyroid  disease Maternal Uncle      Review of Systems  Constitutional:  Negative for chills and fever.  HENT: Negative.  Negative for congestion and sore throat.   Respiratory: Negative.  Negative for cough and shortness of breath.   Cardiovascular: Negative.  Negative for chest pain and palpitations.  Gastrointestinal:  Negative for abdominal pain, nausea and vomiting.  Genitourinary: Negative.  Negative for dysuria and hematuria.  Skin:  Positive for rash.  Neurological: Negative.  Negative for dizziness and headaches.    Vitals:   08/01/24 1332  BP: 114/80  Pulse: 78  Temp: 98.1 F (36.7 C)  SpO2: 97%    Physical Exam Vitals reviewed.  Constitutional:      Appearance: Normal appearance.  HENT:     Head: Normocephalic.  Eyes:     Extraocular Movements: Extraocular movements intact.  Cardiovascular:     Rate and Rhythm: Normal rate.  Pulmonary:     Effort: Pulmonary effort is normal.  Musculoskeletal:     Comments: Right foot: Mild tenderness to deep palpation of calcaneal area  Skin:    General: Skin is warm and dry.     Comments: Left axilla: Slightly tender erythematous nonfluctuant lump  Neurological:     Mental Status: She is alert and oriented to person, place, and time.  Psychiatric:        Mood and Affect: Mood normal.        Behavior: Behavior normal.      ASSESSMENT & PLAN: Problem List Items Addressed This Visit       Musculoskeletal and Integument   Infected cyst of skin - Primary   Recommend to start cefadroxil  500 mg twice a day Bactroban  topically twice a day Advised to contact the office if no better or worse during the next several days Warm compresses several times  during the day      Relevant Medications   cefadroxil  (DURICEF) 500 MG capsule   mupirocin  ointment (BACTROBAN ) 2 %     Other   Chronic bilateral back pain   Relevant Medications   meloxicam  (MOBIC ) 15 MG tablet   Right foot pain   Unremarkable physical exam Recommend x-ray today.  Will review images when available Pain management discussed.  Meloxicam  helps.  Continue as needed. Recommend podiatry evaluation.  Referral placed today.      Relevant Medications   meloxicam  (MOBIC ) 15 MG tablet   Other Relevant Orders   DG Foot Complete Right   Ambulatory referral to Podiatry   Patient Instructions  Absceso en la piel Skin Abscess  Un absceso en la piel es una zona de la piel infectada. Puede contener pus. Un absceso puede presentarse en cualquier parte del cuerpo. Algunos abscesos se abren (explotan) por s solos. La mayora sigue empeorando a menos que runner, broadcasting/film/video. Si el absceso no se trata, la infeccin puede extenderse ms profundamente en el cuerpo y la sangre. Esto puede hacer que se sienta mal. Cules son las causas? Grmenes que ingresan en la piel. Esto puede suceder si tiene lo siguiente: Un corte o un rasguo. Una herida producida por una aguja o una picadura de insecto. Obstruccin de las glndulas sebceas o sudorparas. Un problema en el lugar donde el cabello ingresa en la piel. Un saco lleno de lquido llamado quiste debajo de la piel. Qu incrementa el riesgo? Tener problemas con el movimiento de la sangre por el cuerpo. Tener debilitado el sistema de defensa del cuerpo (sistema inmunitario). Tener diabetes. Tener la piel seca e irritada. Necesitar aplicarse vacunas con frecuencia. Inyectarse drogas en el cuerpo con ignacia gambler. Tener una astilla o alguna otra cosa en la piel. Fumar. Cules son los signos o sntomas? Una protuberancia firme debajo de la piel que le duele. Una protuberancia con pus en la parte superior. Enrojecimiento e  hinchazn. Zonas calientes o sensibles. Una lcera en la piel. Cmo se trata? Es posible que deba hacer lo siguiente: Scientific Laboratory Technician una compresa trmica o un pao tibio y chief executive officer. Hacer que le drenen el pus. Tomar antibiticos. Siga estas indicaciones en su casa: Medicamentos Use los medicamentos de venta libre y los recetados solamente como se lo haya indicado el mdico. Si le recetaron antibiticos, tmelos como se lo haya indicado el mdico. No deje de tomarlos aunque comience a sentirse mejor. Cuidado del absceso  Si tiene un absceso que no ha drenado, [employer] l. Use la fuente de calor que el mdico le recomiende, como una compresa de calor hmedo o una almohadilla trmica. Coloque una toalla entre la piel y la fuente de calor. Aplique calor durante 20 a 30 minutos. Si la piel se le pone de color rojo brillante, retire company secretary de inmediato para evitar quemaduras. El riesgo de quemaduras es mayor si no puede sentir el dolor, el calor o el fro. Siga las instrucciones el mdico en lo que respecta al cuidado del absceso. Asegrese de hacer lo siguiente: Cubra el absceso con un vendaje. Lvese las manos con agua y jabn durante al menos 20 segundos antes y despus de cambiarse la venda. Use un desinfectante para manos si no dispone de agua y jabn. Cambie las vendas como se lo haya indicado el mdico. Controle el absceso todos los das para observar si hay signos de que la infeccin [place]. Preste atencin a los siguientes signos: Aumento del enrojecimiento, la hinchazn o chief technology officer. Ms lquido malva bertrand. Calor. Ms pus o peor olor. Indicaciones generales Para evitar que la infeccin se propague: No comparta objetos personales o toallas. No se bae en un jacuzzi con [employer]. Evite el contacto piel con piel con otras personas. Tenga cuidado al [employer] vendajes usados o el pus del absceso. No fume ni consuma ningn producto  que contenga nicotina o  tabaco. Si necesita ayuda para dejar de consumir estos productos, consulte al mdico. Comunquese con un mdico si: Observa lneas rojas en la piel cerca del absceso. Tiene signos de que una infeccin empeora. Vomita cada vez que come o bebe. Tiene fiebre, escalofros o dolores musculares. El quiste o absceso reaparece. Solicite ayuda de inmediato si: Nurse, adult. Hace menos pis (orina) de lo normal. Esta informacin no tiene como fin reemplazar el consejo del mdico. Asegrese de hacerle al mdico cualquier pregunta que tenga. Document Revised: 04/19/2022 Document Reviewed: 04/19/2022 Elsevier Patient Education  2024 Elsevier Inc.    Emil Schaumann, MD [facility]     [1] No Known Allergies

## 2024-08-16 ENCOUNTER — Ambulatory Visit: Admitting: Podiatry

## 2024-08-16 DIAGNOSIS — M722 Plantar fascial fibromatosis: Secondary | ICD-10-CM

## 2024-08-16 NOTE — Progress Notes (Signed)
"  °  Subjective:  Patient ID: Bethany Sheppard, female    DOB: 1970/11/20,  MRN: [id number]  Chief Complaint  Patient presents with   Foot Pain    Patient presents today c/o B/L heel pain , patient relates she has tried cold and warm compressions as well as meloxicam  relates only meloxicam  has helped with pain alleviation     53 y.o. female presents with the above complaint.  Patient presents with right heel pain that has been off for quite some time has progressed gotten worse worse with ambulation and shoe pressure patient would like to discuss treatment options for her.  She is Spanish-speaking patient.  There is a nurse, learning disability in the room as well.  Pain scale is 7 out of 10 dull aching nature worse with taking for step in the morning.   Review of Systems: Negative except as noted in the HPI. Denies N/V/F/Ch.  Past Medical History:  Diagnosis Date   H/O vitamin D  deficiency    Current Medications[1]  Tobacco Use History[2]  Allergies[3] Objective:  There were no vitals filed for this visit. There is no height or weight on file to calculate BMI. Constitutional Well developed. Well nourished.  Vascular Dorsalis pedis pulses palpable bilaterally. Posterior tibial pulses palpable bilaterally. Capillary refill normal to all digits.  No cyanosis or clubbing noted. Pedal hair growth normal.  Neurologic Normal speech. Oriented to person, place, and time. Epicritic sensation to light touch grossly present bilaterally.  Dermatologic Nails well groomed and normal in appearance. No open wounds. No skin lesions.  Orthopedic: Normal joint ROM without pain or crepitus bilaterally. No visible deformities. Tender to palpation at the calcaneal tuber right. No pain with calcaneal squeeze right. Ankle ROM diminished range of motion right. Silfverskiold Test: positive right.   Radiographs: None  Assessment:   1. Plantar fasciitis of right foot    Plan:  Patient was evaluated  and treated and all questions answered.  Plantar Fasciitis, right - XR reviewed as above.  - Educated on icing and stretching. Instructions given.  - Injection delivered to the plantar fascia as below. - DME: Plantar fascial brace dispensed to support the medial longitudinal arch of the foot and offload pressure from the heel and prevent arch collapse during weightbearing - Pharmacologic management: None  Procedure: Injection Tendon/Ligament Location: Right plantar fascia at the glabrous junction; medial approach. Skin Prep: alcohol Injectate: 0.5 cc 0.5% marcaine  plain, 0.5 cc of 1% Lidocaine , 0.5 cc kenalog 10. Disposition: Patient tolerated procedure well. Injection site dressed with a band-aid.  No follow-ups on file.    [1]  Current Outpatient Medications:    acetic acid -hydrocortisone  (VOSOL -HC) OTIC solution, Place 3 drops into the left ear 3 (three) times daily., Disp: 10 mL, Rfl: 1   cyclobenzaprine  (FLEXERIL ) 10 MG tablet, Take 1 tablet (10 mg total) by mouth at bedtime., Disp: 30 tablet, Rfl: 0   meloxicam  (MOBIC ) 15 MG tablet, Take 1 tablet (15 mg total) by mouth daily as needed for pain., Disp: 30 tablet, Rfl: 1   mupirocin  ointment (BACTROBAN ) 2 %, Apply to affected area twice a day, Disp: 22 g, Rfl: 0 [2]  Social History Tobacco Use  Smoking Status Never  Smokeless Tobacco Never  [3] No Known Allergies  "

## 2024-08-26 ENCOUNTER — Ambulatory Visit: Payer: Self-pay

## 2024-08-26 NOTE — Telephone Encounter (Signed)
 FYI Only or Action Required?: FYI only for provider: appointment scheduled on 08/27/24.  Patient was last seen in primary care on 08/01/2024 by Purcell Emil Schanz, MD.  Called Nurse Triage reporting Cyst.  Symptoms began several weeks ago.  Interventions attempted: Prescription medications: cefadroxil  (DURICEF) 500 MG capsule.  Symptoms are: stable.  Triage Disposition: See PCP When Office is Open (Within 3 Days)  Patient/caregiver understands and will follow disposition?: Yes  Reason for Disposition  [1] Treatment (antibiotic, I&D, or moist heat) > 72 hours (3 days) AND [2] symptoms are SAME (not getting better)  Answer Assessment - Initial Assessment Questions Seen to PCP 08/01/24  1. APPEARANCE: What does the boil (abscess) look like?      Pink, oozing pus and blood when cleaning it, feels hard inside  2. LOCATION: Where is the boil located?      Left underarm  3. NUMBER: How many boils are there?      1  4. SIZE: How big is the boil? (e.g., inches, cm; compare to size of a coin or other object)     Penny sized   5. ONSET: When did the boil start?     4 weeks  6. PAIN: Is there any pain? If Yes, ask: How bad is the pain?  (Scale 1-10; or mild, moderate, severe)     Painful to touch  7. FEVER: Do you have a fever? If Yes, ask: What is it, how was it measured, and when did it start?      Denies  8. TREATMENT: What treatment did you get or are you getting for the boil? (e.g., I&D, antibiotics, moist heat)     cefadroxil  (DURICEF) 500 MG capsule, mupirocin  ointment (BACTROBAN ) 2 %  9. OTHER SYMPTOMS: Do you have any other symptoms? (e.g., rash elsewhere on body, shaking chills, spreading redness of nearby skin or red streaks, weakness)      *No Answer*  Protocols used: Boil (Skin Abscess) on Treatment Follow-up Call-A-AH  Copied from CRM #[id number]. Topic: Clinical - Red Word Triage >> Aug 26, 2024  3:53 PM Darshell M wrote: Red Word that  prompted transfer to Nurse Triage: patient saw provider for skin rash and was given antibiotic and cream. Patient reporting she has completed the antibiotics but rash is worse, painful and now there is blood and pus present.

## 2024-08-27 ENCOUNTER — Ambulatory Visit: Admitting: Emergency Medicine

## 2024-08-27 ENCOUNTER — Ambulatory Visit: Admitting: Physician Assistant

## 2024-08-27 ENCOUNTER — Encounter: Payer: Self-pay | Admitting: Emergency Medicine

## 2024-08-27 VITALS — BP 115/73 | HR 107 | Temp 98.4°F | Ht 64.0 in | Wt 160.0 lb

## 2024-08-27 DIAGNOSIS — L02419 Cutaneous abscess of limb, unspecified: Secondary | ICD-10-CM | POA: Diagnosis not present

## 2024-08-27 MED ORDER — CEFADROXIL 500 MG PO CAPS: 500.0000 mg | ORAL_CAPSULE | Freq: Two times a day (BID) | ORAL | 0 refills | Status: AC

## 2024-08-27 NOTE — Assessment & Plan Note (Signed)
 Cellulitis responded to oral antibiotic put abscess remains Incision and drainage done with good results Tolerated procedure well Local wound care instructions given Recommend to restart cefadroxil  500 mg twice a day for 7 days Instructed to remove packing in 2 days Follow-up in the office as needed

## 2024-08-27 NOTE — Progress Notes (Signed)
 Bethany Sheppard 54 y.o.   Chief Complaint  Patient presents with   Cyst    Pt states she has a cyst under her left arm which has been draining x2 days ad causing discomfort/pain     HISTORY OF PRESENT ILLNESS: This is a 54 y.o. female here for follow-up of 08/01/2024 office visit when she presented with left axillary cyst Was started on antibiotics. Started draining couple days ago Still complaining of pain.  HPI   Prior to Admission medications  Medication Sig Start Date End Date Taking? Authorizing Provider  acetic acid -hydrocortisone  (VOSOL -HC) OTIC solution Place 3 drops into the left ear 3 (three) times daily. 06/12/23  Yes Renella Steig, Emil Schanz, MD  cyclobenzaprine  (FLEXERIL ) 10 MG tablet Take 1 tablet (10 mg total) by mouth at bedtime. 08/17/22  Yes Kaidin Boehle, Emil Schanz, MD  meloxicam  (MOBIC ) 15 MG tablet Take 1 tablet (15 mg total) by mouth daily as needed for pain. 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   Low grade squamous intraepithelial lesion (LGSIL) on cervicovaginal cytologic smear 11/24/2020   Pelvic mass 11/24/2020   Perimenopausal disorder 11/24/2020   Bilateral ovarian cysts 12/31/2014   Iron deficiency anemia 06/13/2014   History of vitamin D  deficiency 04/23/2014    Past Medical History:  Diagnosis Date   H/O vitamin D  deficiency     Past Surgical History:  Procedure Laterality Date    PELVIC LAPAROSCOPY     tubal ligation    Social History   Socioeconomic History   Marital status: Single    Spouse name: Not on file   Number of children: Not on file   Years of education: Not on file   Highest education level: GED or equivalent  Occupational History   Not on file  Tobacco Use   Smoking status: Never   Smokeless tobacco: Never  Vaping Use   Vaping status: Never Used  Substance and Sexual Activity   Alcohol use: Yes    Comment: occ   Drug use: No   Sexual activity: Yes    Birth control/protection: Surgical    Comment: TUBAL LIGATION  Other Topics Concern   Not on file  Social History Narrative   Not on file   Social Drivers of Health   Tobacco Use: Low Risk (08/27/2024)   Patient History    Smoking Tobacco Use: Never    Smokeless Tobacco Use: Never    Passive Exposure: Not on file  Financial Resource Strain: Low Risk (04/15/2024)   Overall Financial Resource Strain (CARDIA)    Difficulty of Paying Living Expenses: Not very hard  Food Insecurity: No Food Insecurity (04/15/2024)   Epic    Worried About Radiation Protection Practitioner of Food in the Last Year: Never true    [employer] of Food in the Last Year: Never true  Transportation Needs: No Transportation Needs (04/15/2024)   Epic    Lack of Transportation (Medical): No  Lack of Transportation (Non-Medical): No  Physical Activity: Insufficiently Active (04/15/2024)   Exercise Vital Sign    Days of Exercise per Week: 1 day    Minutes of Exercise per Session: 50 min  Stress: No Stress Concern Present (04/15/2024)   [employer] of Occupational Health - Occupational Stress Questionnaire    Feeling of Stress: Only a little  Social Connections: Moderately Isolated (04/15/2024)   Social Connection and Isolation Panel    Frequency of Communication with Friends and Family: Three times a week    Frequency of Social Gatherings with Friends and Family: Three times a week    Attends Religious Services: Never    Active Member of  Clubs or Organizations: No    Attends Banker Meetings: Not on file    Marital Status: Living with partner  Intimate Partner Violence: Not on file  Depression (PHQ2-9): Low Risk (08/27/2024)   Depression (PHQ2-9)    PHQ-2 Score: 0  Alcohol Screen: Low Risk (04/15/2024)   Alcohol Screen    Last Alcohol Screening Score (AUDIT): 3  Housing: Unknown (04/15/2024)   Epic    Unable to Pay for Housing in the Last Year: No    Number of Times Moved in the Last Year: Not on file    Homeless in the Last Year: No  Utilities: Not on file  Health Literacy: Not on file    Family History  Problem Relation Age of Onset   Hypertension Sister    Cancer Brother        stomach   Thyroid  disease Maternal Uncle      Review of Systems  Constitutional: Negative.  Negative for chills and fever.  HENT:  Negative for congestion.   Respiratory:  Negative for cough.   Cardiovascular: Negative.  Negative for chest pain and palpitations.  Gastrointestinal:  Negative for abdominal pain, nausea and vomiting.  Neurological:  Negative for dizziness and headaches.    Vitals:   08/27/24 1329  BP: 115/73  Pulse: (!) 107  Temp: 98.4 F (36.9 C)  SpO2: 93%    Physical Exam Vitals reviewed.  Constitutional:      Appearance: Normal appearance.  HENT:     Head: Normocephalic.  Eyes:     Extraocular Movements: Extraocular movements intact.  Cardiovascular:     Rate and Rhythm: Normal rate.  Pulmonary:     Effort: Pulmonary effort is normal.  Skin:    General: Skin is warm and dry.     Comments: Left axillary area: Fluctuant erythematous tender mass compatible with small abscess formation  Neurological:     Mental Status: She is alert and oriented to person, place, and time.  Psychiatric:        Mood and Affect: Mood normal.        Behavior: Behavior normal.    After obtaining verbal consent from the patient, incision and drainage was performed.  Area cleaned with Betadine and sterile  field created.  2 cc of 1% lidocaine  with epi used for field block.  Small incision made with disposable blade and small amount of purulent material drained.  Packed with quarter inch gauze and sterile dressing applied over it.  Tolerated procedure well.  No complications.  ASSESSMENT & PLAN: Problem List Items Addressed This Visit       Other   Axillary abscess - Primary   Cellulitis responded to oral antibiotic put abscess remains Incision and drainage done with good results Tolerated procedure well Local wound care instructions  given Recommend to restart cefadroxil  500 mg twice a day for 7 days Instructed to remove packing in 2 days Follow-up in the office as needed      Relevant Medications   cefadroxil  (DURICEF) 500 MG capsule   Patient Instructions  Incisin y drenaje, cuidados posteriores Incision and Drainage, Care After Despus de la incisin y el drenaje, es normal tener lo siguiente: Dolor o associate professor alrededor del environmental consultant de la incisin. Sangre, lquido o pus (secrecin) de la incisin. Enrojecimiento y piel firme alrededor del lugar de la incisin. Siga estas instrucciones en su casa: Medicamentos Use los medicamentos de venta libre y los recetados solamente como se lo haya indicado el mdico. Si le recetaron antibiticos, selos como se lo haya indicado el mdico. No deje de usar el antibitico aunque comience a actor. No use cremas, ungentos ni lquidos a menos que se lo haya indicado el mdico. Cuidado de la herida Siga las instrucciones del mdico acerca del cuidado de la herida. Asegrese de hacer lo siguiente: Lvese las manos con agua y jabn durante al menos 20 segundos antes y despus de cambiarse la venda (vendaje). Use desinfectante para manos si no dispone de agua y jabn. Cambie el vendaje y producer, television/film/video se lo haya indicado el mdico. Si el vendaje est seco o adherido cuando intenta quitarlo, humedzcalo o mjelo con solucin salina o con  agua. Esto le ayudar a retirarlo sin daar la piel o los tejidos. Si tiene un taponamiento en la herida, djelo colocado hasta que el mdico le indique que se lo quite. Para retirarlo, humedezca o moje el taponamiento con solucin salina o agua. No retire los puntos (suturas), la goma para cerrar la piel ni las tiras de cinta adhesiva. Es posible que estos cierres cutneos deban quedar puestos en la piel durante 2 semanas o ms tiempo. Si los bordes de las tiras de cinta adhesiva empiezan a despegarse y scientific laboratory technician, puede recortar los que estn sueltos. No retire las tiras de cinta adhesiva por completo a menos que el mdico se lo indique. Controle la herida [employer] para detectar signos de infeccin. Est atento a los siguientes signos: Aumento del enrojecimiento, la hinchazn o chief technology officer. Ms lquido malva bertrand. Calor. Pus o mal olor. Si lo enviaron de regreso a su casa con un tubo de drenaje colocado, siga las instrucciones del mdico sobre: Cmo vaciarlo. Cmo cuidarlo en su casa. Tenga cuidado al [facility] vendajes usados, el taponamiento de la herida o educational psychologist. Actividad Haga que la zona afectada repose. Retome sus actividades normales como se lo haya indicado el mdico. Pregntele al mdico qu actividades son seguras para usted. Instrucciones generales No consuma ningn producto que contenga nicotina o tabaco. Estos productos incluyen cigarrillos, tabaco para theatre manager y aparatos de vapeo, como los cigarrillos electrnicos. Estos pueden retrasar la cicatrizacin de la incisin despus de la ciruga. Si necesita ayuda para dejar de fumar, consulte al mdico. No tome baos, no nade ni use el jacuzzi hasta que el mdico lo apruebe. Pregntele al mdico si puede ducharse. Refugio lowing solo le permitan darse baos de [place]. La incisin continuar drenando. Es normal tener una secrecin clara o ligeramente sanguinolenta. La cantidad de secrecin debera teacher, adult education. Concurra a  todas las visitas de seguimiento. El mdico deber asegurarse de que la incisin se est cicatrizando bien y que no haya problemas. El mdico podr darle instrucciones ms especficas. Asegrese de saber lo que puede y lo que no puede  hacer Comunquese con un mdico si: El quiste o absceso reaparecen. Tiene signos de infeccin. Nota lneas rojas que se extienden desde el lugar de la incisin. Tiene fiebre o escalofros. Solicite ayuda de inmediato si: Tiene dolor intenso o sangrado. Comienza a sentir falta de aire. Siente dolor en el pecho. Tiene signos de una infeccin grave. Puede notar cambios en la zona de la incisin, por ejemplo: Hinchazn que hace que la piel se sienta dura. Adormecimiento u hormigueo. Aumento repentino del enrojecimiento. Cambio en el color de la piel de rojo a morado, y principal financial. Ampollas, lceras o separacin de la piel. Estos sntomas pueden customer service manager. Solicite ayuda de inmediato. Llame al 911. No espere a ver si los sntomas desaparecen. No conduzca por sus propios medios [employer]ax incorporated. Esta informacin no tiene theme park manager el consejo del mdico. Asegrese de hacerle al mdico cualquier pregunta que tenga. Document Revised: 04/20/2022 Document Reviewed: 04/20/2022 Elsevier Patient Education  2024 Elsevier Inc.    Emil Schaumann, MD [facility]     [1] No Known Allergies

## 2024-08-27 NOTE — Patient Instructions (Signed)
 Incisin y drenaje, cuidados posteriores Incision and Drainage, Care After Despus de la incisin y el drenaje, es normal tener lo siguiente: Dolor o Associate Professor alrededor del Environmental consultant de la incisin. Sangre, lquido o pus (secrecin) de la incisin. Enrojecimiento y piel firme alrededor del Environmental consultant de la incisin. Siga estas instrucciones en su casa: Medicamentos Use los medicamentos de venta libre y los recetados solamente como se lo haya indicado el mdico. Si le recetaron antibiticos, selos como se lo haya indicado el mdico. No deje de usar el antibitico aunque comience a Actor. No use cremas, ungentos ni lquidos a menos que se lo haya indicado el mdico. Cuidado de la herida Siga las instrucciones del mdico acerca del cuidado de la herida. Asegrese de hacer lo siguiente: Lvese las manos con agua y jabn durante al menos 20 segundos antes y despus de cambiarse la venda (vendaje). Use desinfectante para manos si no dispone de [place] y [place]. Cambie el vendaje y Producer, television/film/video se lo haya indicado el mdico. Si el vendaje est seco o adherido cuando intenta quitarlo, humedzcalo o mjelo con solucin salina o con agua. Esto le ayudar a retirarlo sin daar la piel o los tejidos. Si tiene un taponamiento en la herida, djelo colocado hasta que el mdico le indique que se lo quite. Para retirarlo, humedezca o moje el taponamiento con solucin salina o agua. No retire los puntos (suturas), la goma para cerrar la piel ni las tiras de [place]. Es posible que estos cierres cutneos deban quedar puestos en la piel durante 2 semanas o ms tiempo. Si los bordes de las tiras de [place] a despegarse y Scientific laboratory technician, puede recortar los que estn sueltos. No retire las tiras de cinta adhesiva por completo a menos que el mdico se lo indique. Controle la herida [employer] para detectar signos de infeccin. Est atento a los siguientes signos: Aumento del enrojecimiento,  la hinchazn o Chief Technology Officer. Ms lquido [place]. Calor. Pus o mal olor. Si lo enviaron de regreso a su casa con un tubo de drenaje colocado, siga las instrucciones del mdico sobre: Cmo vaciarlo. Cmo cuidarlo en su casa. Tenga cuidado al [facility] vendajes usados, el taponamiento de la herida o Educational psychologist. Actividad Haga que la zona afectada repose. Retome sus actividades normales como se lo haya indicado el mdico. Pregntele al mdico qu actividades son seguras para usted. Instrucciones generales No consuma ningn producto que contenga nicotina o tabaco. Estos productos incluyen cigarrillos, tabaco para Theatre manager y aparatos de vapeo, como los Administrator, Civil Service. Estos pueden retrasar la cicatrizacin de la incisin despus de la ciruga. Si necesita ayuda para dejar de fumar, consulte al mdico. No tome baos, no nade ni use el jacuzzi hasta que el mdico lo apruebe. Pregntele al mdico si puede ducharse. Delle Reining solo le permitan darse baos de [place]. La incisin continuar drenando. Es normal tener una secrecin clara o ligeramente sanguinolenta. La cantidad de secrecin debera Teacher, adult education. Concurra a todas las visitas de seguimiento. El mdico deber asegurarse de que la incisin se est cicatrizando bien y que no haya problemas. El mdico podr darle instrucciones ms especficas. Asegrese de saber lo que puede y lo que no puede hacer Comunquese con un mdico si: El quiste o absceso reaparecen. Tiene signos de infeccin. Nota lneas rojas que se extienden desde el lugar de la incisin. Tiene fiebre o escalofros. Solicite ayuda de inmediato si: Tiene dolor intenso o sangrado. Comienza a sentir falta de  aire. Siente dolor en el pecho. Tiene signos de una infeccin grave. Puede notar cambios en la zona de la incisin, por ejemplo: Hinchazn que hace que la piel se sienta dura. Adormecimiento u hormigueo. Aumento repentino del enrojecimiento. Cambio en el  color de la piel de rojo a morado, y [employer]. Ampollas, lceras o separacin de la piel. Estos sntomas pueden Customer service manager. Solicite ayuda de inmediato. Llame al 911. No espere a ver si los sntomas desaparecen. No conduzca por sus propios medios [employer]. Esta informacin no tiene Theme park manager el consejo del mdico. Asegrese de hacerle al mdico cualquier pregunta que tenga. Document Revised: 04/20/2022 Document Reviewed: 04/20/2022 Elsevier Patient Education  2024 [employer].

## 2024-09-18 ENCOUNTER — Ambulatory Visit (INDEPENDENT_AMBULATORY_CARE_PROVIDER_SITE_OTHER)

## 2024-09-18 ENCOUNTER — Ambulatory Visit: Admitting: Podiatry

## 2024-09-18 DIAGNOSIS — M722 Plantar fascial fibromatosis: Secondary | ICD-10-CM

## 2024-09-18 DIAGNOSIS — Q666 Other congenital valgus deformities of feet: Secondary | ICD-10-CM

## 2024-09-18 NOTE — Progress Notes (Unsigned)
 Right plantar fasciitis injection did not help much we will go to a cam boot   Pes planovalgus orthotics

## 2024-10-23 ENCOUNTER — Ambulatory Visit: Admitting: Podiatry
